# Patient Record
Sex: Male | Born: 1970 | Race: Black or African American | Hispanic: No | Marital: Married | Smoking: Never smoker
Health system: Southern US, Community
[De-identification: ages and names within clinical notes are randomized; demographics above are authoritative.]

## PROBLEM LIST (undated history)

## (undated) DIAGNOSIS — U071 COVID-19: Secondary | ICD-10-CM

## (undated) DIAGNOSIS — Q613 Polycystic kidney, unspecified: Secondary | ICD-10-CM

## (undated) DIAGNOSIS — I10 Essential (primary) hypertension: Secondary | ICD-10-CM

## (undated) DIAGNOSIS — T7840XA Allergy, unspecified, initial encounter: Secondary | ICD-10-CM

## (undated) DIAGNOSIS — R7303 Prediabetes: Secondary | ICD-10-CM

## (undated) HISTORY — DX: Prediabetes: R73.03

## (undated) HISTORY — DX: Polycystic kidney, unspecified: Q61.3

## (undated) HISTORY — DX: Essential (primary) hypertension: I10

## (undated) HISTORY — DX: COVID-19: U07.1

## (undated) HISTORY — DX: Allergy, unspecified, initial encounter: T78.40XA

---

## 2001-11-06 ENCOUNTER — Emergency Department (HOSPITAL_COMMUNITY): Admission: EM | Admit: 2001-11-06 | Discharge: 2001-11-06 | Payer: Self-pay | Admitting: Emergency Medicine

## 2001-11-06 ENCOUNTER — Encounter: Payer: Self-pay | Admitting: Emergency Medicine

## 2014-08-24 ENCOUNTER — Ambulatory Visit: Payer: BC Managed Care – PPO | Admitting: Internal Medicine

## 2014-09-27 ENCOUNTER — Emergency Department (HOSPITAL_COMMUNITY)
Admission: EM | Admit: 2014-09-27 | Discharge: 2014-09-27 | Disposition: A | Discharge status: Home / Self Care | Payer: BC Managed Care – PPO | Attending: Emergency Medicine | Admitting: Emergency Medicine

## 2014-09-27 ENCOUNTER — Encounter (HOSPITAL_COMMUNITY): Payer: Self-pay | Admitting: *Deleted

## 2014-09-27 ENCOUNTER — Emergency Department (HOSPITAL_COMMUNITY): Payer: BC Managed Care – PPO

## 2014-09-27 DIAGNOSIS — N281 Cyst of kidney, acquired: Secondary | ICD-10-CM | POA: Diagnosis not present

## 2014-09-27 DIAGNOSIS — R109 Unspecified abdominal pain: Secondary | ICD-10-CM | POA: Diagnosis present

## 2014-09-27 LAB — URINALYSIS, ROUTINE W REFLEX MICROSCOPIC
Bilirubin Urine: NEGATIVE
GLUCOSE, UA: NEGATIVE mg/dL
Ketones, ur: NEGATIVE mg/dL
Nitrite: NEGATIVE
Protein, ur: NEGATIVE mg/dL
Specific Gravity, Urine: 1.019 (ref 1.005–1.030)
Urobilinogen, UA: 1 mg/dL (ref 0.0–1.0)
pH: 7.5 (ref 5.0–8.0)

## 2014-09-27 LAB — URINE MICROSCOPIC-ADD ON

## 2014-09-27 MED ORDER — OXYCODONE-ACETAMINOPHEN 5-325 MG PO TABS: 2.0000 | ORAL_TABLET | ORAL | Status: DC | PRN

## 2014-09-27 MED ORDER — TAMSULOSIN HCL 0.4 MG PO CAPS: 0.4000 mg | ORAL_CAPSULE | Freq: Two times a day (BID) | ORAL | Status: DC

## 2014-09-27 MED ORDER — ONDANSETRON HCL 4 MG PO TABS: 4.0000 mg | ORAL_TABLET | Freq: Four times a day (QID) | ORAL | Status: DC

## 2014-09-27 NOTE — ED Provider Notes (Signed)
CSN: [id number]     Arrival date & time 09/27/14  1903 History   First MD Initiated Contact with Patient 09/27/14 1915     Chief Complaint  Patient presents with  . Flank Pain     (Consider location/radiation/quality/duration/timing/severity/associated sxs/prior Treatment) HPI Benjamin Griffith is a 44 y.o. male who comes in for evaluation of left flank pain. Patient states he was outside grilling at approximately 4:30 PM when he began to experience a left flank discomfort that he characterizes as "tightness and sharp" that was waxing and waning. At onset it was 9/10, but now in the ED he rates it as a 3/10. He denies dysuria, hematuria, overt abdominal pain. He reports associated diaphoresis during the event as well as nausea without vomiting. Denies fevers, chills, short of breath, chest pain, numbness or weakness, rash.  History reviewed. No pertinent past medical history. History reviewed. No pertinent past surgical history. History reviewed. No pertinent family history. History  Substance Use Topics  . Smoking status: Never Smoker   . Smokeless tobacco: Not on file  . Alcohol Use: No    Review of Systems A 10 point review of systems was completed and was negative except for pertinent positives and negatives as mentioned in the history of present illness     Allergies  Review of patient's allergies indicates no known allergies.  Home Medications   Prior to Admission medications   Medication Sig Start Date End Date Taking? Authorizing Provider  ondansetron (ZOFRAN) 4 MG tablet Take 1 tablet (4 mg total) by mouth every 6 (six) hours. 09/27/14   Comer Locket, PA-C  oxyCODONE-acetaminophen (PERCOCET/ROXICET) 5-325 MG per tablet Take 2 tablets by mouth every 4 (four) hours as needed for severe pain. 09/27/14   Comer Locket, PA-C  tamsulosin (FLOMAX) 0.4 MG CAPS capsule Take 1 capsule (0.4 mg total) by mouth 2 (two) times daily. 09/27/14   Aleiyah Halpin, PA-C   BP 131/73 mmHg   Pulse 92  Temp(Src) 98.6 F (37 C) (Oral)  Resp 19  SpO2 99% Physical Exam  Constitutional: He is oriented to person, place, and time. He appears well-developed and well-nourished.  HENT:  Head: Normocephalic and atraumatic.  Mouth/Throat: Oropharynx is clear and moist.  Eyes: Conjunctivae are normal. Pupils are equal, round, and reactive to light. Right eye exhibits no discharge. Left eye exhibits no discharge. No scleral icterus.  Neck: Neck supple.  Cardiovascular: Normal rate, regular rhythm and normal heart sounds.   Pulmonary/Chest: Effort normal and breath sounds normal. No respiratory distress. He has no wheezes. He has no rales.  Abdominal: Soft. He exhibits no distension and no mass. There is no tenderness. There is no rebound and no guarding.  No CVA tenderness.  Musculoskeletal: He exhibits no tenderness.  Neurological: He is alert and oriented to person, place, and time.  Cranial Nerves II-XII grossly intact  Skin: Skin is warm and dry. No rash noted.  Psychiatric: He has a normal mood and affect.  Nursing note and vitals reviewed.   ED Course  Procedures (including critical care time) Labs Review Labs Reviewed  URINALYSIS, ROUTINE W REFLEX MICROSCOPIC (NOT AT [facility]) - Abnormal; Notable for the following:    Hgb urine dipstick TRACE (*)    Leukocytes, UA TRACE (*)    All other components within normal limits  URINE MICROSCOPIC-ADD ON    Imaging Review US Renal  09/27/2014   CLINICAL DATA:  Left flank pain 1 day  EXAM: RENAL / URINARY TRACT ULTRASOUND COMPLETE  COMPARISON:  None.  FINDINGS: Right Kidney:  Length: 14.1 cm. Multiple right renal cysts. Lower pole cyst 4.5 x 5.1 cm. Midpole complex cyst 2.4 x 3.3 cm. The cysts appears separate and do not appear to represent hydronephrosis.  Left Kidney:  Length: 16.6 cm. Multiple left renal cysts. Complex midpole cyst 5.9 x 4.5 cm. Simple lower pole cyst 5.1 x 6.0 cm. Additional smaller cysts are noted on the left.   Bladder:  Debris in the bladder which could be infection or blood. Bilateral ureteral jets noted by Doppler.  Prostate enlargement measuring 5.2 x 3.5 x 4.0 cm.  IMPRESSION: Multiple bilateral renal cysts. This is not entirely typical for polycystic kidney disease however there are numerous cysts present. Correlate with renal function tests. Negative for hydronephrosis  Debris in the bladder which may represent blood or infection. Correlate with urinalysis.   Electronically Signed   By: Franchot Gallo M.D.   On: 09/27/2014 20:37     EKG Interpretation None     Meds given in ED:  Medications - No data to display  Discharge Medication List as of 09/27/2014  9:28 PM    START taking these medications   Details  ondansetron (ZOFRAN) 4 MG tablet Take 1 tablet (4 mg total) by mouth every 6 (six) hours., Starting 09/27/2014, Until Discontinued, Print    oxyCODONE-acetaminophen (PERCOCET/ROXICET) 5-325 MG per tablet Take 2 tablets by mouth every 4 (four) hours as needed for severe pain., Starting 09/27/2014, Until Discontinued, Print    tamsulosin (FLOMAX) 0.4 MG CAPS capsule Take 1 capsule (0.4 mg total) by mouth 2 (two) times daily., Starting 09/27/2014, Until Discontinued, Print       Filed Vitals:   09/27/14 1908 09/27/14 2127  BP: 135/87 131/73  Pulse: 60 92  Temp: 98.2 F (36.8 C) 98.6 F (37 C)  TempSrc: Oral Oral  Resp: 18 19  SpO2: 97% 99%    MDM  Vitals stable - WNL -afebrile Pt resting comfortably in ED. PE--benign abdominal exam. There is a benign physical exam. Labwork-Trace hgb and leukocytes in urine,  Imaging--Multiple bil renal cysts. No hydroneophrosis  DDX--No evidence of urosepsis, overt UTI. Will trxt for stone. Referral given to f/u with nephro for further evaluation of bilateral renal cysts. Will DC with pain medicines, tamsulosin, Zofran. I discussed all relevant lab findings and imaging results with pt and they verbalized understanding. Discussed f/u with PCP  within 48 hrs and return precautions, pt very amenable to plan.  Final diagnoses:  Bilateral renal cysts        Comer Locket, PA-C 09/27/14 2259  Dorie Rank, MD 09/29/14 2255

## 2014-09-27 NOTE — ED Notes (Signed)
Bed: WA20 Expected date: 09/27/14 Expected time: 6:45 PM Means of arrival: Ambulance Comments: Flank pain

## 2014-09-27 NOTE — Discharge Instructions (Signed)
You were evaluated today for your abdominal discomfort. You are likely experiencing discomfort related to a kidney stone. On ultrasound were also found to have bilateral kidney cysts. It is important to follow nephrology for further evaluation and management of your symptoms. Return to ED for any worsening symptoms. Take your medications as prescribed.

## 2014-09-27 NOTE — ED Notes (Signed)
Patient was doing some light housework and had a sudden onset of left flank pain that radiated down to his groin. He became sweaty and nauseated, this is typical for him when he has pain. Patient was given 30mg  of torodal and 38mcg of fentanyl in route. He also had 4mg  of zofran.

## 2014-10-03 ENCOUNTER — Other Ambulatory Visit (HOSPITAL_COMMUNITY): Payer: Self-pay | Admitting: Urology

## 2014-10-03 DIAGNOSIS — N281 Cyst of kidney, acquired: Secondary | ICD-10-CM

## 2014-10-05 ENCOUNTER — Ambulatory Visit (HOSPITAL_COMMUNITY)
Admission: RE | Admit: 2014-10-05 | Discharge: 2014-10-05 | Disposition: A | Discharge status: Home / Self Care | Payer: BC Managed Care – PPO | Source: Ambulatory Visit | Attending: Urology | Admitting: Urology

## 2014-10-05 DIAGNOSIS — N281 Cyst of kidney, acquired: Secondary | ICD-10-CM | POA: Insufficient documentation

## 2014-10-05 DIAGNOSIS — N289 Disorder of kidney and ureter, unspecified: Secondary | ICD-10-CM | POA: Insufficient documentation

## 2014-10-11 ENCOUNTER — Encounter: Payer: Self-pay | Admitting: Primary Care

## 2014-10-11 ENCOUNTER — Ambulatory Visit (INDEPENDENT_AMBULATORY_CARE_PROVIDER_SITE_OTHER): Payer: BC Managed Care – PPO | Admitting: Primary Care

## 2014-10-11 VITALS — BP 134/94 | HR 60 | Temp 98.0°F | Wt 205.1 lb

## 2014-10-11 DIAGNOSIS — R0981 Nasal congestion: Secondary | ICD-10-CM

## 2014-10-11 MED ORDER — FLUTICASONE PROPIONATE 50 MCG/ACT NA SUSP: 2.0000 | Freq: Every day | NASAL | Status: DC

## 2014-10-11 NOTE — Patient Instructions (Addendum)
Start taking Zyrtec every night for allergy symtptoms. Use Flonase once daily. 2 puffs into each nostril once daily.  Do not use bobby pins in your ears. For ear wax overproduction you may try Debrox drops, which may be purchased over the counter.  Set up an appointment up front to establish with Dr. Deborra Medina, your PCP.  It was nice meeting you!

## 2014-10-11 NOTE — Progress Notes (Signed)
Pre visit review using our clinic review tool, if applicable. No additional management support is needed unless otherwise documented below in the visit note. 

## 2014-10-11 NOTE — Progress Notes (Signed)
   Subjective:    Patient ID: Benjamin Griffith, male    DOB: 1971/03/31, 44 y.o.   MRN: [id number]  HPI  Mr. Fenoglio is a 44 year old male who presents today with a chief complaint of nasal congestion. Every morning for the past 6 months he will experience nasal congestion and will blow out "pink tinged sputum". He reports frontal sinus pressure, headaches. His symptoms are worse when air is blowing directly in his face. He's tried advil sinus and congestion, "relax nasal spray" and mucinex with some relief. He feels well overall, not acutely ill. Denies fevers, shortness of breath, cough.  Review of Systems  Constitutional: Negative for fever and chills.  HENT: Positive for congestion and sinus pressure. Negative for ear pain and sore throat.   Respiratory: Negative for cough and shortness of breath.   Cardiovascular: Negative for chest pain.  Gastrointestinal: Negative for nausea.       No past medical history on file.  History   Social History  . Marital Status: Single    Spouse Name: N/A  . Number of Children: N/A  . Years of Education: N/A   Occupational History  . Not on file.   Social History Main Topics  . Smoking status: Never Smoker   . Smokeless tobacco: Not on file  . Alcohol Use: No  . Drug Use: No  . Sexual Activity: Not on file   Other Topics Concern  . Not on file   Social History Narrative    No past surgical history on file.  No family history on file.  No Known Allergies  No current outpatient prescriptions on file prior to visit.   No current facility-administered medications on file prior to visit.    BP 134/94 mmHg  Pulse 60  Temp(Src) 98 F (36.7 C) (Oral)  Wt 205 lb 1.9 oz (93.042 kg)  SpO2 98%     Objective:   Physical Exam  Constitutional: He appears well-nourished. He does not appear ill.  HENT:  Right Ear: Tympanic membrane and ear canal normal.  Left Ear: Tympanic membrane and ear canal normal.  Nose: Right sinus exhibits no  maxillary sinus tenderness and no frontal sinus tenderness. Left sinus exhibits no maxillary sinus tenderness and no frontal sinus tenderness.  Mouth/Throat: Oropharynx is clear and moist.  Eyes: Conjunctivae are normal. Pupils are equal, round, and reactive to light.  Cardiovascular: Normal rate and regular rhythm.   Pulmonary/Chest: Effort normal and breath sounds normal.  Skin: Skin is warm and dry.          Assessment & Plan:  Nasal congestion:  Related to allergies most likely. No acute symptoms of active sinusitis or bronchitis. He appears well. Exam unremarkable. RX for Flonase daily, and Zyrtec OTC daily at bedtime. Encouraged patient to establish with his PCP as he has not done so at this time. Follow up PRN

## 2014-10-25 ENCOUNTER — Ambulatory Visit (INDEPENDENT_AMBULATORY_CARE_PROVIDER_SITE_OTHER): Payer: BC Managed Care – PPO | Admitting: Primary Care

## 2014-10-25 ENCOUNTER — Encounter: Payer: Self-pay | Admitting: Primary Care

## 2014-10-25 VITALS — BP 134/84 | HR 84 | Temp 98.0°F | Ht 68.0 in | Wt 206.4 lb

## 2014-10-25 DIAGNOSIS — L309 Dermatitis, unspecified: Secondary | ICD-10-CM | POA: Diagnosis not present

## 2014-10-25 DIAGNOSIS — Q613 Polycystic kidney, unspecified: Secondary | ICD-10-CM | POA: Diagnosis not present

## 2014-10-25 NOTE — Progress Notes (Signed)
Pre visit review using our clinic review tool, if applicable. No additional management support is needed unless otherwise documented below in the visit note. 

## 2014-10-25 NOTE — Patient Instructions (Signed)
Start taking Zyrtec every night for allergy symptoms.  Please schedule a physical with me in the next 1-2 months. You will also schedule a lab only appointment one week prior. We will discuss your lab results during your physical.  It was a pleasure to see you today! Please don't hesitate to call me with any questions. Welcome to [facility]!

## 2014-10-25 NOTE — Assessment & Plan Note (Signed)
Present since childhood. Currently not bothersome, does not apply OTC products.

## 2014-10-25 NOTE — Assessment & Plan Note (Signed)
Diagnosed June 2016.  Following with Dr. Alyson Ingles with urology and will be seeing nephrologist later this summer. Will obtain labs at physical next visit.

## 2014-10-25 NOTE — Progress Notes (Signed)
   Subjective:    Patient ID: Benjamin Griffith, male    DOB: 1970-07-23, 44 y.o.   MRN: [id number]  HPI  Benjamin Griffith is a 44 year old male who presents today to establish care and discuss the problems mentioned below. Will obtain old records.   1) Polycystic renal disease: Presented to the emergency department in June with complaints of flank pain. He was found to have about 20 cysts total to bilateral kidneys. He is following with Dr. Alyson Ingles with Urology and has an appointment scheduled for December 2016. He is also to see a nephrologist later this summer. Overall he's had some minor dull pain to his flanks which is tolerable and doesn't inhibit his daily activities. He was once drinking 2 liters of [facility] daily with consumption of other sodas and sweet tea. He's only drinking water and cranberry juice.   2) Eczema: Present for years. Mostly occurs during summer months.     Review of Systems  Constitutional: Negative for fatigue and unexpected weight change.  HENT: Negative for rhinorrhea.   Respiratory: Negative for cough and shortness of breath.   Cardiovascular: Negative for chest pain.  Gastrointestinal: Negative for diarrhea, constipation and blood in stool.  Genitourinary: Negative for difficulty urinating.  Musculoskeletal: Negative for myalgias.       Chronic knee pain  Skin: Negative for rash.  Neurological: Negative for dizziness, numbness and headaches.  Psychiatric/Behavioral:       Denies concerns for anxiety or depression.       History reviewed. No pertinent past medical history.  History   Social History  . Marital Status: Single    Spouse Name: N/A  . Number of Children: N/A  . Years of Education: N/A   Occupational History  . Not on file.   Social History Main Topics  . Smoking status: Never Smoker   . Smokeless tobacco: Not on file  . Alcohol Use: No  . Drug Use: No  . Sexual Activity: Not on file   Other Topics Concern  . Not on file   Social  History Narrative    History reviewed. No pertinent past surgical history.  Family History  Problem Relation Age of Onset  . Hypertension Mother   . Diabetes Father   . Hypertension Father   . Kidney disease Maternal Grandmother   . Kidney disease Paternal Grandmother     No Known Allergies  Current Outpatient Prescriptions on File Prior to Visit  Medication Sig Dispense Refill  . fluticasone (FLONASE) 50 MCG/ACT nasal spray Place 2 sprays into both nostrils daily. (Patient not taking: Reported on 10/25/2014) 16 g 0   No current facility-administered medications on file prior to visit.    BP 134/84 mmHg  Pulse 84  Temp(Src) 98 F (36.7 C) (Oral)  Ht 5\' 8"  (1.727 m)  Wt 206 lb 6.4 oz (93.622 kg)  BMI 31.39 kg/m2  SpO2 97%    Objective:   Physical Exam  Constitutional: He is oriented to person, place, and time.  Cardiovascular: Normal rate and regular rhythm.   Pulmonary/Chest: Effort normal and breath sounds normal.  Abdominal: There is no CVA tenderness.  Musculoskeletal: Normal range of motion.  Neurological: He is alert and oriented to person, place, and time.  Skin: Skin is warm and dry.  Psychiatric: He has a normal mood and affect.          Assessment & Plan:

## 2014-11-11 ENCOUNTER — Other Ambulatory Visit: Payer: Self-pay | Admitting: Primary Care

## 2014-11-11 DIAGNOSIS — Z0000 Encounter for general adult medical examination without abnormal findings: Secondary | ICD-10-CM

## 2014-11-18 ENCOUNTER — Other Ambulatory Visit (INDEPENDENT_AMBULATORY_CARE_PROVIDER_SITE_OTHER): Payer: BC Managed Care – PPO

## 2014-11-18 DIAGNOSIS — Z0000 Encounter for general adult medical examination without abnormal findings: Secondary | ICD-10-CM | POA: Diagnosis not present

## 2014-11-18 LAB — COMPREHENSIVE METABOLIC PANEL
ALBUMIN: 4.1 g/dL (ref 3.5–5.2)
ALT: 18 U/L (ref 0–53)
AST: 22 U/L (ref 0–37)
Alkaline Phosphatase: 123 U/L — ABNORMAL HIGH (ref 39–117)
BILIRUBIN TOTAL: 0.5 mg/dL (ref 0.2–1.2)
BUN: 15 mg/dL (ref 6–23)
CALCIUM: 9.4 mg/dL (ref 8.4–10.5)
CO2: 29 mEq/L (ref 19–32)
Chloride: 102 mEq/L (ref 96–112)
Creatinine, Ser: 1.3 mg/dL (ref 0.40–1.50)
GFR: 77.31 mL/min (ref >60.00)
GLUCOSE: 139 mg/dL — AB (ref 70–99)
Potassium: 4.6 mEq/L (ref 3.5–5.1)
Sodium: 138 mEq/L (ref 135–145)
Total Protein: 7.2 g/dL (ref 6.0–8.3)

## 2014-11-18 LAB — CBC
HCT: 45 % (ref 39.0–52.0)
Hemoglobin: 15.1 g/dL (ref 13.0–17.0)
MCHC: 33.5 g/dL (ref 30.0–36.0)
MCV: 90.3 fl (ref 78.0–100.0)
PLATELETS: 324 10*3/uL (ref 150.0–400.0)
RBC: 4.98 Mil/uL (ref 4.22–5.81)
RDW: 13.5 % (ref 11.5–15.5)
WBC: 6.3 10*3/uL (ref 4.0–10.5)

## 2014-11-18 LAB — LIPID PANEL
Cholesterol: 149 mg/dL (ref 0–200)
HDL: 46.3 mg/dL (ref >39.00)
LDL CALC: 90 mg/dL (ref 0–99)
NonHDL: 102.52
Total CHOL/HDL Ratio: 3
Triglycerides: 64 mg/dL (ref 0.0–149.0)
VLDL: 12.8 mg/dL (ref 0.0–40.0)

## 2014-11-18 LAB — HEMOGLOBIN A1C: Hgb A1c MFr Bld: 5.8 % (ref 4.6–6.5)

## 2014-11-23 ENCOUNTER — Encounter: Payer: Self-pay | Admitting: Primary Care

## 2014-11-23 ENCOUNTER — Ambulatory Visit (INDEPENDENT_AMBULATORY_CARE_PROVIDER_SITE_OTHER): Payer: BC Managed Care – PPO | Admitting: Primary Care

## 2014-11-23 VITALS — BP 132/92 | HR 58 | Temp 97.5°F | Ht 68.0 in | Wt 208.5 lb

## 2014-11-23 DIAGNOSIS — R030 Elevated blood-pressure reading, without diagnosis of hypertension: Secondary | ICD-10-CM

## 2014-11-23 DIAGNOSIS — Z23 Encounter for immunization: Secondary | ICD-10-CM | POA: Diagnosis not present

## 2014-11-23 DIAGNOSIS — Z0000 Encounter for general adult medical examination without abnormal findings: Secondary | ICD-10-CM | POA: Diagnosis not present

## 2014-11-23 DIAGNOSIS — I10 Essential (primary) hypertension: Secondary | ICD-10-CM | POA: Insufficient documentation

## 2014-11-23 NOTE — Assessment & Plan Note (Signed)
Tetanus due, administered today. Labs show borderline diabetes. Discussed the importance of healthy diet and exercise BP also noted to be elevated on 2 separate occasions. He would like to work on exercising and improving his diet to help lower. Exam unremarkable today. He is to follow up with urology in December.

## 2014-11-23 NOTE — Progress Notes (Signed)
Pre visit review using our clinic review tool, if applicable. No additional management support is needed unless otherwise documented below in the visit note. 

## 2014-11-23 NOTE — Patient Instructions (Signed)
You are borderline diabetic.  Start to limit consumption of rice, pasta, and sugary drinks such as lemonade, sodas, sweet tea. This will help to normalize your blood sugar and prevent diabetes.  Your blood pressure is slightly elevated.  Work to increase exercise and improve your diet.  Follow up in 1 month for re-evaluation of blood pressure. If it's still high then we will need to consider medication at our next visit.  It was a pleasure to see you today!

## 2014-11-23 NOTE — Progress Notes (Signed)
Subjective:    Patient ID: Benjamin Griffith, male    DOB: 1970-04-26, 44 y.o.   MRN: [id number]  HPI  Benjamin Griffith is a 44 year old male who presents today for complete physical.  Immunizations: -Tetanus: Unsure of last tetanus. Nothing recorded in [facility] -Influenza: Did not receive last season   Diet: Endorses a fair diet. Breakfast: Skips Lunch: Salad, grilled chicken, sandwiches Dinner: Chicken, pasta, roast, vegetables, rice Beverages: Water, lemonade, cranberry juice Exercise: He exercises 2-3 days weekly at the gym for 2 hours. Will also play basketball Eye exam: Has not completed in over 10 years. Slight change in vision. Dental exam: Has not been in over 8 years  BP Readings from Last 3 Encounters:  11/23/14 132/92  10/25/14 134/84  10/11/14 134/94    2) Elevated BP: Noted on 2 separate office visits. He would like to try to improve his diet and start running again as he once did. He would also like to work on improving his diet.  Review of Systems  Constitutional: Negative for unexpected weight change.  HENT: Negative for rhinorrhea.   Respiratory: Negative for cough and shortness of breath.   Cardiovascular: Negative for chest pain.  Gastrointestinal: Negative for diarrhea and constipation.  Genitourinary: Negative for difficulty urinating.  Musculoskeletal: Negative for myalgias and arthralgias.  Skin: Negative for rash.  Neurological: Negative for dizziness and headaches.  Psychiatric/Behavioral:       Denies concerns for anxiety or depression       No past medical history on file.  Social History   Social History  . Marital Status: Single    Spouse Name: N/A  . Number of Children: N/A  . Years of Education: N/A   Occupational History  . Not on file.   Social History Main Topics  . Smoking status: Never Smoker   . Smokeless tobacco: Not on file  . Alcohol Use: No  . Drug Use: No  . Sexual Activity: Not on file   Other Topics Concern  . Not on file    Social History Narrative    No past surgical history on file.  Family History  Problem Relation Age of Onset  . Hypertension Mother   . Diabetes Father   . Hypertension Father   . Kidney disease Maternal Grandmother   . Kidney disease Paternal Grandmother     No Known Allergies  Current Outpatient Prescriptions on File Prior to Visit  Medication Sig Dispense Refill  . fluticasone (FLONASE) 50 MCG/ACT nasal spray Place 2 sprays into both nostrils daily. 16 g 0   No current facility-administered medications on file prior to visit.    BP 132/92 mmHg  Pulse 58  Temp(Src) 97.5 F (36.4 C) (Oral)  Ht 5\' 8"  (1.727 m)  Wt 208 lb 8 oz (94.575 kg)  BMI 31.71 kg/m2  SpO2 97%    Objective:   Physical Exam  Constitutional: He is oriented to person, place, and time. He appears well-nourished.  HENT:  Right Ear: Tympanic membrane and ear canal normal.  Left Ear: Tympanic membrane and ear canal normal.  Mouth/Throat: Oropharynx is clear and moist.  Eyes: Conjunctivae and EOM are normal. Pupils are equal, round, and reactive to light.  Neck: Neck supple.  Cardiovascular: Normal rate and regular rhythm.   Pulmonary/Chest: Effort normal and breath sounds normal.  Abdominal: Soft. Bowel sounds are normal. There is no tenderness.  Musculoskeletal: Normal range of motion.  Lymphadenopathy:    He has no cervical adenopathy.  Neurological: He is alert and oriented to person, place, and time. He has normal reflexes. No cranial nerve deficit.  Skin: Skin is warm and dry.  Psychiatric: He has a normal mood and affect.          Assessment & Plan:

## 2014-11-23 NOTE — Assessment & Plan Note (Signed)
Elevated in June and today. Will allow him to work on lowering through diet and exercise. Will follow up in one month, if continues to be elevated, will treat with medication.

## 2014-12-22 ENCOUNTER — Ambulatory Visit: Payer: BC Managed Care – PPO | Admitting: Primary Care

## 2015-01-02 ENCOUNTER — Ambulatory Visit: Payer: BC Managed Care – PPO | Admitting: Primary Care

## 2015-01-06 ENCOUNTER — Ambulatory Visit (INDEPENDENT_AMBULATORY_CARE_PROVIDER_SITE_OTHER): Payer: BC Managed Care – PPO | Admitting: Primary Care

## 2015-01-06 ENCOUNTER — Encounter: Payer: Self-pay | Admitting: Primary Care

## 2015-01-06 VITALS — BP 144/102 | HR 68 | Temp 98.2°F | Ht 68.0 in | Wt 202.1 lb

## 2015-01-06 DIAGNOSIS — R0981 Nasal congestion: Secondary | ICD-10-CM

## 2015-01-06 DIAGNOSIS — J3489 Other specified disorders of nose and nasal sinuses: Secondary | ICD-10-CM | POA: Diagnosis not present

## 2015-01-06 DIAGNOSIS — R030 Elevated blood-pressure reading, without diagnosis of hypertension: Secondary | ICD-10-CM | POA: Diagnosis not present

## 2015-01-06 MED ORDER — PREDNISONE 20 MG PO TABS: ORAL_TABLET | ORAL | Status: DC

## 2015-01-06 NOTE — Progress Notes (Signed)
Pre visit review using our clinic review tool, if applicable. No additional management support is needed unless otherwise documented below in the visit note. 

## 2015-01-06 NOTE — Assessment & Plan Note (Signed)
Elevated in clinic today, home readings elevated. Highly recommended treatment with medication. He refuses medication and insists on working to reduce weight and eating habits. Discussed long term effects of hypertension and consequence of continued elevation. He is to check his BP daily and notify me if readings are above 140/90. He will follow up in 2 months, if no improvement then will place on medication.

## 2015-01-06 NOTE — Progress Notes (Signed)
   Subjective:    Patient ID: Benjamin Griffith, male    DOB: 04/22/1970, 44 y.o.   MRN: [id number]  HPI  Benjamin Griffith is a 44 year old male who presents today for follow up. He presented as a new patient in June and noted to have an elevated blood pressure. He's had an elevated reading in August and again today in the clinic. He's recently being treated for polycystic kidney disease that was diagnosed in June 2016. He's been checking his blood pressure at home and has been getting readings of 138/88, 137/90, 144/80. He is not interested in taking medication and has been working to reduce weight through exercise.   BP Readings from Last 3 Encounters:  01/06/15 144/102  11/23/14 132/92  10/25/14 134/84     Review of Systems  Eyes: Negative for visual disturbance.  Respiratory: Negative for shortness of breath.   Cardiovascular: Negative for chest pain.  Neurological: Negative for dizziness and headaches.       No past medical history on file.  Social History   Social History  . Marital Status: Single    Spouse Name: N/A  . Number of Children: N/A  . Years of Education: N/A   Occupational History  . Not on file.   Social History Main Topics  . Smoking status: Never Smoker   . Smokeless tobacco: Not on file  . Alcohol Use: No  . Drug Use: No  . Sexual Activity: Not on file   Other Topics Concern  . Not on file   Social History Narrative    No past surgical history on file.  Family History  Problem Relation Age of Onset  . Hypertension Mother   . Diabetes Father   . Hypertension Father   . Kidney disease Maternal Grandmother   . Kidney disease Paternal Grandmother     No Known Allergies  Current Outpatient Prescriptions on File Prior to Visit  Medication Sig Dispense Refill  . cetirizine (ZYRTEC) 10 MG tablet Take 10 mg by mouth daily as needed for allergies.    . fluticasone (FLONASE) 50 MCG/ACT nasal spray Place 2 sprays into both nostrils daily. (Patient not  taking: Reported on 01/06/2015) 16 g 0   No current facility-administered medications on file prior to visit.    BP 144/102 mmHg  Pulse 68  Temp(Src) 98.2 F (36.8 C) (Oral)  Ht 5\' 8"  (1.727 m)  Wt 202 lb 1.9 oz (91.681 kg)  BMI 30.74 kg/m2  SpO2 97%    Objective:   Physical Exam  Constitutional: He appears well-nourished.  Cardiovascular: Normal rate and regular rhythm.   Pulmonary/Chest: Effort normal and breath sounds normal.  Skin: Skin is warm and dry.          Assessment & Plan:

## 2015-01-06 NOTE — Patient Instructions (Signed)
Start checking your blood pressure every day. Check it around the same time daily after 30 minutes of rest. Record your readings and bring them to your next visit.  Work to decrease consumption of salt (fried foods, fatty foods, frozen dinners, etc.)  I highly recommend we start a blood pressure medication today.  Start prednisone tablets for nasal congestion and sinus pressure. Take 3 tablets daily for 3 days, then 2 tablets for 3 days, then 1 tablet for 3 days.  Please schedule a follow up appointment in 2 months.  It was a pleasure to see you today!

## 2015-01-19 ENCOUNTER — Emergency Department (HOSPITAL_COMMUNITY): Payer: BC Managed Care – PPO

## 2015-01-19 ENCOUNTER — Emergency Department (HOSPITAL_COMMUNITY)
Admission: EM | Admit: 2015-01-19 | Discharge: 2015-01-19 | Disposition: A | Discharge status: Home / Self Care | Payer: BC Managed Care – PPO | Attending: Emergency Medicine | Admitting: Emergency Medicine

## 2015-01-19 ENCOUNTER — Encounter (HOSPITAL_COMMUNITY): Payer: Self-pay | Admitting: Emergency Medicine

## 2015-01-19 DIAGNOSIS — R079 Chest pain, unspecified: Secondary | ICD-10-CM | POA: Diagnosis present

## 2015-01-19 DIAGNOSIS — M79602 Pain in left arm: Secondary | ICD-10-CM | POA: Insufficient documentation

## 2015-01-19 DIAGNOSIS — Z79899 Other long term (current) drug therapy: Secondary | ICD-10-CM | POA: Diagnosis not present

## 2015-01-19 LAB — BASIC METABOLIC PANEL
Anion gap: 9 (ref 5–15)
BUN: 19 mg/dL (ref 6–20)
CHLORIDE: 103 mmol/L (ref 101–111)
CO2: 26 mmol/L (ref 22–32)
CREATININE: 1.11 mg/dL (ref 0.61–1.24)
Calcium: 9.4 mg/dL (ref 8.9–10.3)
GFR calc Af Amer: >60 mL/min (ref >60)
GFR calc non Af Amer: >60 mL/min (ref >60)
GLUCOSE: 102 mg/dL — AB (ref 65–99)
POTASSIUM: 4 mmol/L (ref 3.5–5.1)
SODIUM: 138 mmol/L (ref 135–145)

## 2015-01-19 LAB — I-STAT TROPONIN, ED: Troponin i, poc: 0 ng/mL (ref 0.00–0.08)

## 2015-01-19 LAB — CBC
HEMATOCRIT: 46.1 % (ref 39.0–52.0)
Hemoglobin: 15.5 g/dL (ref 13.0–17.0)
MCH: 30.5 pg (ref 26.0–34.0)
MCHC: 33.6 g/dL (ref 30.0–36.0)
MCV: 90.6 fL (ref 78.0–100.0)
Platelets: 364 10*3/uL (ref 150–400)
RBC: 5.09 MIL/uL (ref 4.22–5.81)
RDW: 13.1 % (ref 11.5–15.5)
WBC: 8.2 10*3/uL (ref 4.0–10.5)

## 2015-01-19 NOTE — ED Notes (Addendum)
Pt complaining of left chest pain starting last night with radiation into left arm with left arm numbness and tingling. Recently been having difficulty controlling his blood pressure d/t his polycystic kidney disease. Pt denies SOB, N/V/D, in no obvious distress in triage, vitals WNL. States the last time something like this happened it ended up being a pulled muscle. Equal radial pulses bilaterally, PERRL, heart/lung sounds clear to auscultation.

## 2015-01-19 NOTE — ED Notes (Signed)
Pt with Hx of hypertension, not currently taking meds. Denies lightheadedness, dizziness, steady gait upon d/c in NAD.

## 2015-01-19 NOTE — Discharge Instructions (Signed)
Follow up with your md next week.  Return if  problems °

## 2015-01-19 NOTE — ED Provider Notes (Signed)
CSN: [id number]     Arrival date & time 01/19/15  1507 History   First MD Initiated Contact with Patient 01/19/15 1646     Chief Complaint  Patient presents with  . Chest Pain  . Arm Pain     (Consider location/radiation/quality/duration/timing/severity/associated sxs/prior Treatment) Patient is a 44 y.o. male presenting with chest pain and arm pain. The history is provided by the patient (The patient states that he's had some left-sided chest pain off and on. I last for a couple minutes. Not related to exertion. No family history of coronary artery disease).  Chest Pain Pain location:  L chest Pain quality: aching   Pain radiates to:  Does not radiate Pain radiates to the back: no   Pain severity:  Mild Timing:  Intermittent Progression:  Resolved Chronicity:  New Context: not breathing   Relieved by:  Nothing Associated symptoms: no abdominal pain, no back pain, no cough, no fatigue and no headache   Arm Pain Associated symptoms include chest pain. Pertinent negatives include no abdominal pain and no headaches.    History reviewed. No pertinent past medical history. History reviewed. No pertinent past surgical history. Family History  Problem Relation Age of Onset  . Hypertension Mother   . Diabetes Father   . Hypertension Father   . Kidney disease Maternal Grandmother   . Kidney disease Paternal Grandmother    Social History  Substance Use Topics  . Smoking status: Never Smoker   . Smokeless tobacco: None  . Alcohol Use: No    Review of Systems  Constitutional: Negative for appetite change and fatigue.  HENT: Negative for congestion, ear discharge and sinus pressure.   Eyes: Negative for discharge.  Respiratory: Negative for cough.   Cardiovascular: Positive for chest pain.  Gastrointestinal: Negative for abdominal pain and diarrhea.  Genitourinary: Negative for frequency and hematuria.  Musculoskeletal: Negative for back pain.  Skin: Negative for rash.   Neurological: Negative for seizures and headaches.  Psychiatric/Behavioral: Negative for hallucinations.      Allergies  Review of patient's allergies indicates no known allergies.  Home Medications   Prior to Admission medications   Medication Sig Start Date End Date Taking? Authorizing Provider  cetirizine (ZYRTEC) 10 MG tablet Take 10 mg by mouth daily as needed for allergies.   Yes Historical Provider, MD  ibuprofen (ADVIL,MOTRIN) 200 MG tablet Take 200 mg by mouth every 6 (six) hours as needed for moderate pain.   Yes Historical Provider, MD  Multiple Vitamin (MULTIVITAMIN WITH MINERALS) TABS tablet Take 1 tablet by mouth daily.   Yes Historical Provider, MD  Omega-3 Fatty Acids (FISH OIL PO) Take 1 capsule by mouth 3 (three) times daily.   Yes Historical Provider, MD  oxymetazoline (AFRIN) 0.05 % nasal spray Place 1 spray into both nostrils 2 (two) times daily as needed for congestion.   Yes Historical Provider, MD  fluticasone (FLONASE) 50 MCG/ACT nasal spray Place 2 sprays into both nostrils daily. Patient not taking: Reported on 01/06/2015 10/11/14   Pleas Koch, NP  predniSONE (DELTASONE) 20 MG tablet Take 3 tablets by mouth daily for 3 days, then 2 tablets for 3 days, then 1 tablet for 3 days. Patient not taking: Reported on 01/19/2015 01/06/15   Pleas Koch, NP   BP 143/96 mmHg  Pulse 74  Temp(Src) 98.2 F (36.8 C) (Oral)  Resp 20  SpO2 100% Physical Exam  Constitutional: He is oriented to person, place, and time. He appears well-developed.  HENT:  Head: Normocephalic.  Eyes: Conjunctivae and EOM are normal. No scleral icterus.  Neck: Neck supple. No thyromegaly present.  Cardiovascular: Normal rate and regular rhythm.  Exam reveals no gallop and no friction rub.   No murmur heard. Pulmonary/Chest: No stridor. He has no wheezes. He has no rales. He exhibits no tenderness.  Abdominal: He exhibits no distension. There is no tenderness. There is no rebound.   Musculoskeletal: Normal range of motion. He exhibits no edema.  Lymphadenopathy:    He has no cervical adenopathy.  Neurological: He is oriented to person, place, and time. He exhibits normal muscle tone. Coordination normal.  Skin: No rash noted. No erythema.  Psychiatric: He has a normal mood and affect. His behavior is normal.    ED Course  Procedures (including critical care time) Labs Review Labs Reviewed  BASIC METABOLIC PANEL - Abnormal; Notable for the following:    Glucose, Bld 102 (*)    All other components within normal limits  CBC  I-STAT TROPOININ, ED    Imaging Review Dg Chest 2 View  01/19/2015   CLINICAL DATA:  Left-sided chest pain for 1 day.  EXAM: CHEST  2 VIEW  COMPARISON:  None.  FINDINGS: The heart size and mediastinal contours are within normal limits. Both lungs are clear. The visualized skeletal structures are unremarkable.  IMPRESSION: Normal chest x-ray.   Electronically Signed   By: Marijo Sanes M.D.   On: 01/19/2015 16:06   I have personally reviewed and evaluated these images and lab results as part of my medical decision-making.   EKG Interpretation   Date/Time:  Thursday January 19 2015 15:12:47 EDT Ventricular Rate:  76 PR Interval:  145 QRS Duration: 81 QT Interval:  386 QTC Calculation: 434 R Axis:   64 Text Interpretation:  Sinus rhythm Baseline wander in lead(s) II III aVR  aVF Confirmed by Zenya Hickam  MD, Jomel Whittlesey ([id number]) on 01/19/2015 4:48:33 PM      MDM   Final diagnoses:  Chest pain at rest    Chest pain. With normal labs EKG and chest x-ray. Doubtful that his coronary artery disease. Suspect chest wall pain. Patient is to follow-up with his PCP next week    Milton Ferguson, MD 01/19/15 1929

## 2015-01-20 ENCOUNTER — Telehealth: Payer: Self-pay | Admitting: Primary Care

## 2015-01-20 NOTE — Telephone Encounter (Signed)
Will you ask Mr. Blok how his BP readings have been over the past 2 weeks? Thanks.

## 2015-01-23 NOTE — Telephone Encounter (Signed)
Called and notified patient of Kate's comments. Patient verbalized understanding. BP readings have been the same, elevated readings. Follow up is schedule up for 01/25/15

## 2015-01-23 NOTE — Telephone Encounter (Signed)
Message left for patient to return my call.  

## 2015-01-25 ENCOUNTER — Encounter: Payer: Self-pay | Admitting: Primary Care

## 2015-01-25 ENCOUNTER — Ambulatory Visit (INDEPENDENT_AMBULATORY_CARE_PROVIDER_SITE_OTHER): Payer: BC Managed Care – PPO | Admitting: Primary Care

## 2015-01-25 VITALS — BP 162/102 | HR 67 | Temp 97.8°F | Ht 68.0 in | Wt 206.0 lb

## 2015-01-25 DIAGNOSIS — I10 Essential (primary) hypertension: Secondary | ICD-10-CM | POA: Diagnosis not present

## 2015-01-25 MED ORDER — AMLODIPINE BESYLATE 10 MG PO TABS: 10.0000 mg | ORAL_TABLET | Freq: Every day | ORAL | Status: DC

## 2015-01-25 NOTE — Patient Instructions (Signed)
Start Amlodipine 10 mg tablets for high blood pressure. Take 1 tablet by mouth once daily.  Check your blood pressure daily, around the same time of day, for the next 2 weeks.   Ensure that you have rested for 30 minutes prior to checking your blood pressure. Record your readings and bring them to your next visit.  Follow up in 2 weeks for re-evaluation.  Hypertension Hypertension, commonly called high blood pressure, is when the force of blood pumping through your arteries is too strong. Your arteries are the blood vessels that carry blood from your heart throughout your body. A blood pressure reading consists of a higher number over a lower number, such as 110/72. The higher number (systolic) is the pressure inside your arteries when your heart pumps. The lower number (diastolic) is the pressure inside your arteries when your heart relaxes. Ideally you want your blood pressure below 120/80. Hypertension forces your heart to work harder to pump blood. Your arteries may become narrow or stiff. Having untreated or uncontrolled hypertension can cause heart attack, stroke, kidney disease, and other problems. RISK FACTORS Some risk factors for high blood pressure are controllable. Others are not.  Risk factors you cannot control include:   Race. You may be at higher risk if you are African American.  Age. Risk increases with age.  Gender. Men are at higher risk than women before age 17 years. After age 66, women are at higher risk than men. Risk factors you can control include:  Not getting enough exercise or physical activity.  Being overweight.  Getting too much fat, sugar, calories, or salt in your diet.  Drinking too much alcohol. SIGNS AND SYMPTOMS Hypertension does not usually cause signs or symptoms. Extremely high blood pressure (hypertensive crisis) may cause headache, anxiety, shortness of breath, and nosebleed. DIAGNOSIS To check if you have hypertension, your health care  provider will measure your blood pressure while you are seated, with your arm held at the level of your heart. It should be measured at least twice using the same arm. Certain conditions can cause a difference in blood pressure between your right and left arms. A blood pressure reading that is higher than normal on one occasion does not mean that you need treatment. If it is not clear whether you have high blood pressure, you may be asked to return on a different day to have your blood pressure checked again. Or, you may be asked to monitor your blood pressure at home for 1 or more weeks. TREATMENT Treating high blood pressure includes making lifestyle changes and possibly taking medicine. Living a healthy lifestyle can help lower high blood pressure. You may need to change some of your habits. Lifestyle changes may include:  Following the DASH diet. This diet is high in fruits, vegetables, and whole grains. It is low in salt, red meat, and added sugars.  Keep your sodium intake below 2,300 mg per day.  Getting at least 30-45 minutes of aerobic exercise at least 4 times per week.  Losing weight if necessary.  Not smoking.  Limiting alcoholic beverages.  Learning ways to reduce stress. Your health care provider may prescribe medicine if lifestyle changes are not enough to get your blood pressure under control, and if one of the following is true:  You are 45-67 years of age and your systolic blood pressure is above 140.  You are 72 years of age or older, and your systolic blood pressure is above 150.  Your diastolic blood  pressure is above 90.  You have diabetes, and your systolic blood pressure is over 294 or your diastolic blood pressure is over 90.  You have kidney disease and your blood pressure is above 140/90.  You have heart disease and your blood pressure is above 140/90. Your personal target blood pressure may vary depending on your medical conditions, your age, and other  factors. HOME CARE INSTRUCTIONS  Have your blood pressure rechecked as directed by your health care provider.   Take medicines only as directed by your health care provider. Follow the directions carefully. Blood pressure medicines must be taken as prescribed. The medicine does not work as well when you skip doses. Skipping doses also puts you at risk for problems.  Do not smoke.   Monitor your blood pressure at home as directed by your health care provider. SEEK MEDICAL CARE IF:   You think you are having a reaction to medicines taken.  You have recurrent headaches or feel dizzy.  You have swelling in your ankles.  You have trouble with your vision. SEEK IMMEDIATE MEDICAL CARE IF:  You develop a severe headache or confusion.  You have unusual weakness, numbness, or feel faint.  You have severe chest or abdominal pain.  You vomit repeatedly.  You have trouble breathing. MAKE SURE YOU:   Understand these instructions.  Will watch your condition.  Will get help right away if you are not doing well or get worse.   This information is not intended to replace advice given to you by your health care provider. Make sure you discuss any questions you have with your health care provider.   Document Released: 04/01/2005 Document Revised: 08/16/2014 Document Reviewed: 01/22/2013 Elsevier Interactive Patient Education [employer].

## 2015-01-25 NOTE — Progress Notes (Signed)
Subjective:    Patient ID: Benjamin Griffith, male    DOB: 08/29/1970, 44 y.o.   MRN: [id number]  HPI  Benjamin Griffith is a 44 year old male who presents today for follow up of elevated blood pressure readings. He has been evaluated several times in the past for elevated readings and wanted to try to reduce his blood pressure through diet and exercise. Last visit his readings were still above goal and medication was highly recommended. He declined medication last visit. He was to check his blood pressure for 2 weeks and call them into our office. He called yesterday and his readings have been above goal of 140/90. They have been running 130-160/90-100.  He is interested in starting medication today as we discussed the long term effects of untreated hypertension during our last several visits. Denies chest pain, shortness of breath. He's experienced some headaches and intermittent dizziness.   BP Readings from Last 3 Encounters:  01/25/15 162/102  01/19/15 142/101  01/06/15 144/102     Review of Systems  Respiratory: Negative for shortness of breath.   Cardiovascular: Negative for chest pain.  Neurological: Positive for dizziness and headaches.       No past medical history on file.  Social History   Social History  . Marital Status: Single    Spouse Name: N/A  . Number of Children: N/A  . Years of Education: N/A   Occupational History  . Not on file.   Social History Main Topics  . Smoking status: Never Smoker   . Smokeless tobacco: Not on file  . Alcohol Use: No  . Drug Use: No  . Sexual Activity: Not on file   Other Topics Concern  . Not on file   Social History Narrative    No past surgical history on file.  Family History  Problem Relation Age of Onset  . Hypertension Mother   . Diabetes Father   . Hypertension Father   . Kidney disease Maternal Grandmother   . Kidney disease Paternal Grandmother     No Known Allergies  Current Outpatient Prescriptions on File  Prior to Visit  Medication Sig Dispense Refill  . cetirizine (ZYRTEC) 10 MG tablet Take 10 mg by mouth daily as needed for allergies.    Marland Kitchen ibuprofen (ADVIL,MOTRIN) 200 MG tablet Take 200 mg by mouth every 6 (six) hours as needed for moderate pain.    . Multiple Vitamin (MULTIVITAMIN WITH MINERALS) TABS tablet Take 1 tablet by mouth daily.    . Omega-3 Fatty Acids (FISH OIL PO) Take 1 capsule by mouth 3 (three) times daily.    Marland Kitchen oxymetazoline (AFRIN) 0.05 % nasal spray Place 1 spray into both nostrils 2 (two) times daily as needed for congestion.    . fluticasone (FLONASE) 50 MCG/ACT nasal spray Place 2 sprays into both nostrils daily. (Patient not taking: Reported on 01/06/2015) 16 g 0  . predniSONE (DELTASONE) 20 MG tablet Take 3 tablets by mouth daily for 3 days, then 2 tablets for 3 days, then 1 tablet for 3 days. (Patient not taking: Reported on 01/19/2015) 18 tablet 0   No current facility-administered medications on file prior to visit.    BP 162/102 mmHg  Pulse 67  Temp(Src) 97.8 F (36.6 C) (Oral)  Ht 5\' 8"  (1.727 m)  Wt 206 lb (93.441 kg)  BMI 31.33 kg/m2  SpO2 97%    Objective:   Physical Exam  Constitutional: He appears well-nourished.  Cardiovascular: Normal rate and regular rhythm.  Pulmonary/Chest: Effort normal and breath sounds normal.  Skin: Skin is warm and dry.          Assessment & Plan:

## 2015-01-25 NOTE — Progress Notes (Signed)
Pre visit review using our clinic review tool, if applicable. No additional management support is needed unless otherwise documented below in the visit note. 

## 2015-01-25 NOTE — Assessment & Plan Note (Signed)
Elevated readings at home of 130-160/90-100's. He is agreeable to medication today. Start Amlodipine 10 mg today. Discussed potential side effects. He is to continue to check his BP daily and bring his readings to our next visit. Follow up in 2 weeks for re-evaluation.

## 2015-02-13 ENCOUNTER — Encounter: Payer: Self-pay | Admitting: Primary Care

## 2015-02-13 ENCOUNTER — Ambulatory Visit (INDEPENDENT_AMBULATORY_CARE_PROVIDER_SITE_OTHER): Payer: BC Managed Care – PPO | Admitting: Primary Care

## 2015-02-13 VITALS — BP 126/82 | HR 81 | Temp 98.0°F | Ht 68.0 in | Wt 205.4 lb

## 2015-02-13 DIAGNOSIS — I10 Essential (primary) hypertension: Secondary | ICD-10-CM

## 2015-02-13 NOTE — Progress Notes (Signed)
   Subjective:    Patient ID: Benjamin Griffith, male    DOB: [birth year], 44 y.o.   MRN: [id number]  HPI  Benjamin Griffith is a 44 year old male who presents today for follow up of hypertension. He's been evaluated several times for elevated readings during previous visits but did not want to be initated on oral medications. Last visit he decided to start medication after he'd noticed some elevated readings at home. He was iniitated on Amlopdipine 10 mg last visit.  Since his last visit his BP is much improved in the clinic. He's been checking his BP at [facility] and [facility] and has been getting readings of 130-160/90's. He denies headaches, shortness of breath, chest pain, dizziness. He believes that the machines at [facility] were malfunctioning a few times.  BP Readings from Last 3 Encounters:  02/13/15 126/82  01/25/15 162/102  01/19/15 142/101     Review of Systems  Respiratory: Negative for shortness of breath.   Cardiovascular: Negative for chest pain and leg swelling.  Neurological: Negative for dizziness and headaches.       No past medical history on file.  Social History   Social History  . Marital Status: Single    Spouse Name: N/A  . Number of Children: N/A  . Years of Education: N/A   Occupational History  . Not on file.   Social History Main Topics  . Smoking status: Never Smoker   . Smokeless tobacco: Not on file  . Alcohol Use: No  . Drug Use: No  . Sexual Activity: Not on file   Other Topics Concern  . Not on file   Social History Narrative    No past surgical history on file.  Family History  Problem Relation Age of Onset  . Hypertension Mother   . Diabetes Father   . Hypertension Father   . Kidney disease Maternal Grandmother   . Kidney disease Paternal Grandmother     No Known Allergies  Current Outpatient Prescriptions on File Prior to Visit  Medication Sig Dispense Refill  . amLODipine (NORVASC) 10 MG tablet Take 1 tablet (10 mg total) by mouth  daily. 30 tablet 3  . cetirizine (ZYRTEC) 10 MG tablet Take 10 mg by mouth daily as needed for allergies.    Marland Kitchen ibuprofen (ADVIL,MOTRIN) 200 MG tablet Take 200 mg by mouth every 6 (six) hours as needed for moderate pain.    . Multiple Vitamin (MULTIVITAMIN WITH MINERALS) TABS tablet Take 1 tablet by mouth daily.    . Omega-3 Fatty Acids (FISH OIL PO) Take 1 capsule by mouth 3 (three) times daily.     No current facility-administered medications on file prior to visit.    BP 126/82 mmHg  Pulse 81  Temp(Src) 98 F (36.7 C) (Oral)  Ht 5\' 8"  (1.727 m)  Wt 205 lb 6.4 oz (93.169 kg)  BMI 31.24 kg/m2  SpO2 98%    Objective:   Physical Exam  Constitutional: He appears well-nourished.  Cardiovascular: Normal rate and regular rhythm.   Pulmonary/Chest: Effort normal and breath sounds normal.  Skin: Skin is warm and dry.          Assessment & Plan:

## 2015-02-13 NOTE — Patient Instructions (Signed)
Schedule a lab only appointment after November 5th for diabetes recheck.  Check your blood pressure daily, around the same time of day once to twice weekly.  Ensure that you have rested for 30 minutes prior to checking your blood pressure. Record your readings and notify me if they get above 140/90 consistently.  Please schedule a follow up appointment in 3 months for re-evaluation of blood pressure.  It was a pleasure to see you today!

## 2015-02-13 NOTE — Progress Notes (Signed)
Pre visit review using our clinic review tool, if applicable. No additional management support is needed unless otherwise documented below in the visit note. 

## 2015-02-14 NOTE — Assessment & Plan Note (Signed)
Improved since initiation of Amlodipine 10 mg. He is to notify me if he gets numbers above 150/90 consecutively at home. Continue Amlodipine, follow up in 3 months.

## 2015-02-17 ENCOUNTER — Other Ambulatory Visit: Payer: Self-pay | Admitting: Primary Care

## 2015-02-17 DIAGNOSIS — R7303 Prediabetes: Secondary | ICD-10-CM

## 2015-02-21 ENCOUNTER — Other Ambulatory Visit (INDEPENDENT_AMBULATORY_CARE_PROVIDER_SITE_OTHER): Payer: BC Managed Care – PPO

## 2015-02-21 DIAGNOSIS — R7303 Prediabetes: Secondary | ICD-10-CM

## 2015-02-21 LAB — HEMOGLOBIN A1C: Hgb A1c MFr Bld: 5.9 % (ref 4.6–6.5)

## 2015-02-22 ENCOUNTER — Encounter: Payer: Self-pay | Admitting: *Deleted

## 2015-03-16 ENCOUNTER — Other Ambulatory Visit: Payer: Self-pay | Admitting: Urology

## 2015-03-16 DIAGNOSIS — N281 Cyst of kidney, acquired: Secondary | ICD-10-CM

## 2015-03-28 ENCOUNTER — Ambulatory Visit (HOSPITAL_COMMUNITY)
Admission: RE | Admit: 2015-03-28 | Discharge: 2015-03-28 | Disposition: A | Discharge status: Home / Self Care | Payer: BC Managed Care – PPO | Source: Ambulatory Visit | Attending: Urology | Admitting: Urology

## 2015-03-28 DIAGNOSIS — N281 Cyst of kidney, acquired: Secondary | ICD-10-CM | POA: Insufficient documentation

## 2015-05-16 ENCOUNTER — Encounter: Payer: Self-pay | Admitting: Primary Care

## 2015-05-16 ENCOUNTER — Ambulatory Visit (INDEPENDENT_AMBULATORY_CARE_PROVIDER_SITE_OTHER): Payer: BC Managed Care – PPO | Admitting: Primary Care

## 2015-05-16 VITALS — BP 122/86 | HR 79 | Temp 97.9°F | Ht 68.0 in | Wt 209.8 lb

## 2015-05-16 DIAGNOSIS — R351 Nocturia: Secondary | ICD-10-CM | POA: Diagnosis not present

## 2015-05-16 DIAGNOSIS — I10 Essential (primary) hypertension: Secondary | ICD-10-CM | POA: Diagnosis not present

## 2015-05-16 DIAGNOSIS — R7303 Prediabetes: Secondary | ICD-10-CM

## 2015-05-16 NOTE — Progress Notes (Signed)
Subjective:    Patient ID: Benjamin Griffith, male    DOB: 12/12/1970, 45 y.o.   MRN: [id number]  HPI  Mr. Sammarco is a 45 year old male who presents today for follow up of hypertension. He was initiated on Amlodipine 10 mg in mid to late 2016. He was evaluated in early November 2016 with improvement in blood pressure.   His blood pressure is stable in the office today. He checks his blood pressure once to twice every 2 weeks. He will get readings averaging 130's/80's. Denies chest pain, dizziness, shortness of breath, headaches. He is compliant with his amlodipine 10 mg. He does endorse swelling of his ankles in the late evenings after work. He does stand most of the day.   BP Readings from Last 3 Encounters:  05/16/15 122/86  02/13/15 126/82  01/25/15 162/102   2) Nocturia: Present at night since starting the Amlodipine. He will get up once to twice on average in the middle of night. Denies difficulty initiating his stream, dysuria, hematuria, family history of prostate enlargement or cancers.    Review of Systems  Respiratory: Negative for shortness of breath.   Cardiovascular: Negative for chest pain.       Mild ankle edema in the evenings.  Genitourinary: Negative for dysuria and hematuria.       Nocturia.  Neurological: Negative for dizziness and headaches.       Past Medical History  Diagnosis Date  . Hypertension     Social History   Social History  . Marital Status: Single    Spouse Name: N/A  . Number of Children: N/A  . Years of Education: N/A   Occupational History  . Not on file.   Social History Main Topics  . Smoking status: Never Smoker   . Smokeless tobacco: Not on file  . Alcohol Use: No  . Drug Use: No  . Sexual Activity: Not on file   Other Topics Concern  . Not on file   Social History Narrative    No past surgical history on file.  Family History  Problem Relation Age of Onset  . Hypertension Mother   . Diabetes Father   . Hypertension  Father   . Kidney disease Maternal Grandmother   . Kidney disease Paternal Grandmother     No Known Allergies  Current Outpatient Prescriptions on File Prior to Visit  Medication Sig Dispense Refill  . amLODipine (NORVASC) 10 MG tablet Take 1 tablet (10 mg total) by mouth daily. 30 tablet 3  . cetirizine (ZYRTEC) 10 MG tablet Take 10 mg by mouth daily as needed for allergies.    Marland Kitchen ibuprofen (ADVIL,MOTRIN) 200 MG tablet Take 200 mg by mouth every 6 (six) hours as needed for moderate pain.    . Multiple Vitamin (MULTIVITAMIN WITH MINERALS) TABS tablet Take 1 tablet by mouth daily.    . Omega-3 Fatty Acids (FISH OIL PO) Take 1 capsule by mouth 3 (three) times daily.     No current facility-administered medications on file prior to visit.    BP 122/86 mmHg  Pulse 79  Temp(Src) 97.9 F (36.6 C) (Oral)  Ht 5\' 8"  (1.727 m)  Wt 209 lb 12.8 oz (95.165 kg)  BMI 31.91 kg/m2  SpO2 98%    Objective:   Physical Exam  Constitutional: He appears well-nourished.  Cardiovascular: Normal rate and regular rhythm.   No lower extremity edema noted.  Pulmonary/Chest: Effort normal and breath sounds normal.  Skin: Skin is warm and  dry.          Assessment & Plan:  Nocturia:  Present since initiation of Amlodipine 10 mg.  Will get up once to twice nightly on average. No difficulty initiating stream, hematuria, FH of prostate disorders. Low suspicion for prostate involvement, but will check PSA as he is worried. Declines prostate examination today. Likely due to antihypertensive. Will continue to monitor.

## 2015-05-16 NOTE — Progress Notes (Signed)
Pre visit review using our clinic review tool, if applicable. No additional management support is needed unless otherwise documented below in the visit note. 

## 2015-05-16 NOTE — Patient Instructions (Signed)
Schedule a lab only appointment after February 8th 2017. We will recheck to ensure you are no longer borderline diabetic, and also your prostate function.  Continue amlodipine 10 mg tablets for blood pressure. Please notify me if the ankle swelling becomes bothersome.  Follow up in mid to late August for your annual physical exam.  It was a pleasure to see you today!

## 2015-05-16 NOTE — Assessment & Plan Note (Signed)
Stable in office today. Some ankle edema, more so in the evenings. Likely due to amlodipine, but could also be due to standing on his feet most of the day. Continue amlodipine at this time. He is to notify me if edema becomes bothersome.

## 2015-05-21 ENCOUNTER — Other Ambulatory Visit: Payer: Self-pay | Admitting: Primary Care

## 2015-05-21 DIAGNOSIS — I10 Essential (primary) hypertension: Secondary | ICD-10-CM

## 2015-05-22 NOTE — Telephone Encounter (Signed)
Electronically refill request for   amLODipine (NORVASC) 10 MG tablet   Take 1 tablet (10 mg total) by mouth daily.  Dispense: 30 tablet   Refills: 3     Last prescribed on 01/25/2015. Last seen on 05/16/2015. CPE on 11/24/2015.

## 2015-05-26 ENCOUNTER — Other Ambulatory Visit (INDEPENDENT_AMBULATORY_CARE_PROVIDER_SITE_OTHER): Payer: BC Managed Care – PPO

## 2015-05-26 DIAGNOSIS — R351 Nocturia: Secondary | ICD-10-CM

## 2015-05-26 DIAGNOSIS — R7303 Prediabetes: Secondary | ICD-10-CM | POA: Diagnosis not present

## 2015-05-26 LAB — PSA: PSA: 3.9 ng/mL (ref 0.10–4.00)

## 2015-05-26 LAB — HEMOGLOBIN A1C: Hgb A1c MFr Bld: 5.9 % (ref 4.6–6.5)

## 2015-08-14 ENCOUNTER — Encounter: Payer: Self-pay | Admitting: Primary Care

## 2015-11-24 ENCOUNTER — Encounter: Payer: BC Managed Care – PPO | Admitting: Primary Care

## 2015-11-28 ENCOUNTER — Ambulatory Visit (INDEPENDENT_AMBULATORY_CARE_PROVIDER_SITE_OTHER): Payer: BC Managed Care – PPO | Admitting: Primary Care

## 2015-11-28 ENCOUNTER — Encounter: Payer: Self-pay | Admitting: Primary Care

## 2015-11-28 VITALS — BP 126/82 | HR 78 | Temp 98.2°F | Ht 68.0 in | Wt 208.8 lb

## 2015-11-28 DIAGNOSIS — I10 Essential (primary) hypertension: Secondary | ICD-10-CM

## 2015-11-28 DIAGNOSIS — Z0000 Encounter for general adult medical examination without abnormal findings: Secondary | ICD-10-CM

## 2015-11-28 DIAGNOSIS — R7303 Prediabetes: Secondary | ICD-10-CM

## 2015-11-28 DIAGNOSIS — Q613 Polycystic kidney, unspecified: Secondary | ICD-10-CM

## 2015-11-28 LAB — COMPREHENSIVE METABOLIC PANEL
ALBUMIN: 4.5 g/dL (ref 3.5–5.2)
ALT: 19 U/L (ref 0–53)
AST: 18 U/L (ref 0–37)
Alkaline Phosphatase: 127 U/L — ABNORMAL HIGH (ref 39–117)
BUN: 15 mg/dL (ref 6–23)
CHLORIDE: 103 meq/L (ref 96–112)
CO2: 26 meq/L (ref 19–32)
CREATININE: 1.2 mg/dL (ref 0.40–1.50)
Calcium: 9.9 mg/dL (ref 8.4–10.5)
GFR: 84.4 mL/min (ref >60.00)
GLUCOSE: 104 mg/dL — AB (ref 70–99)
POTASSIUM: 4.1 meq/L (ref 3.5–5.1)
SODIUM: 138 meq/L (ref 135–145)
Total Bilirubin: 0.6 mg/dL (ref 0.2–1.2)
Total Protein: 7.7 g/dL (ref 6.0–8.3)

## 2015-11-28 LAB — LIPID PANEL
CHOL/HDL RATIO: 3
Cholesterol: 163 mg/dL (ref 0–200)
HDL: 55.4 mg/dL (ref >39.00)
LDL CALC: 98 mg/dL (ref 0–99)
NONHDL: 108.08
Triglycerides: 50 mg/dL (ref 0.0–149.0)
VLDL: 10 mg/dL (ref 0.0–40.0)

## 2015-11-28 LAB — HEMOGLOBIN A1C: HEMOGLOBIN A1C: 5.8 % (ref 4.6–6.5)

## 2015-11-28 NOTE — Assessment & Plan Note (Signed)
Continues to follow with nephrology. BMP pending today.

## 2015-11-28 NOTE — Assessment & Plan Note (Signed)
Blood pressure stable in the office today. Continue amlodipine 10 mg. BMP pending.

## 2015-11-28 NOTE — Assessment & Plan Note (Signed)
Immunizations up-to-date. Overall healthy diet and generally leads a healthy lifestyle. Discussed to increase exercise to 5 days weekly. Exam unremarkable today. Labs pending. Recommended annual eye and dental exams. Follow-up in one year for repeat physical.

## 2015-11-28 NOTE — Patient Instructions (Signed)
Complete lab work prior to leaving today. I will notify you of your results once received.   Continue to work towards a healthy lifestyle through diet and exercise.  Increase consumption of water. Reduce sugary drinks such as kool-aid and sweet tea. Ensure you are consuming 64 ounces of water daily.  Follow up in 1 year for repeat physical or sooner if needed.  It was a pleasure to see you today!  Constipation, Adult Constipation is when a person has fewer than three bowel movements a week, has difficulty having a bowel movement, or has stools that are dry, hard, or larger than normal. As people grow older, constipation is more common. A low-fiber diet, not taking in enough fluids, and taking certain medicines may make constipation worse.  CAUSES   Certain medicines, such as antidepressants, pain medicine, iron supplements, antacids, and water pills.   Certain diseases, such as diabetes, irritable bowel syndrome (IBS), thyroid disease, or depression.   Not drinking enough water.   Not eating enough fiber-rich foods.   Stress or travel.   Lack of physical activity or exercise.   Ignoring the urge to have a bowel movement.   Using laxatives too much.  SIGNS AND SYMPTOMS   Having fewer than three bowel movements a week.   Straining to have a bowel movement.   Having stools that are hard, dry, or larger than normal.   Feeling full or bloated.   Pain in the lower abdomen.   Not feeling relief after having a bowel movement.  DIAGNOSIS  Your health care provider will take a medical history and perform a physical exam. Further testing may be done for severe constipation. Some tests may include:  A barium enema X-ray to examine your rectum, colon, and, sometimes, your small intestine.   A sigmoidoscopy to examine your lower colon.   A colonoscopy to examine your entire colon. TREATMENT  Treatment will depend on the severity of your constipation and what is  causing it. Some dietary treatments include drinking more fluids and eating more fiber-rich foods. Lifestyle treatments may include regular exercise. If these diet and lifestyle recommendations do not help, your health care provider may recommend taking over-the-counter laxative medicines to help you have bowel movements. Prescription medicines may be prescribed if over-the-counter medicines do not work.  HOME CARE INSTRUCTIONS   Eat foods that have a lot of fiber, such as fruits, vegetables, whole grains, and beans.  Limit foods high in fat and processed sugars, such as french fries, hamburgers, cookies, candies, and soda.   A fiber supplement may be added to your diet if you cannot get enough fiber from foods.   Drink enough fluids to keep your urine clear or pale yellow.   Exercise regularly or as directed by your health care provider.   Go to the restroom when you have the urge to go. Do not hold it.   Only take over-the-counter or prescription medicines as directed by your health care provider. Do not take other medicines for constipation without talking to your health care provider first.  [place] IF:   You have bright red blood in your stool.   Your constipation lasts for more than 4 days or gets worse.   You have abdominal or rectal pain.   You have thin, pencil-like stools.   You have unexplained weight loss. MAKE SURE YOU:   Understand these instructions.  Will watch your condition.  Will get help right away if you are not  doing well or get worse.   This information is not intended to replace advice given to you by your health care provider. Make sure you discuss any questions you have with your health care provider.   Document Released: 12/29/2003 Document Revised: 04/22/2014 Document Reviewed: 01/11/2013 Elsevier Interactive Patient Education [employer].

## 2015-11-28 NOTE — Progress Notes (Signed)
Subjective:    Patient ID: Benjamin Griffith, male    DOB: 06/17/1970, 45 y.o.   MRN: [id number]  HPI  Benjamin Griffith is a 45 year old male who presents today for complete physical.  Immunizations: -Tetanus: Completed in 2016 -Influenza: Did not receive last season   Diet: He endorses a fair diet. Breakfast: Skips Lunch: Salad, chicken Dinner: Vegetables, lean meat Snacks: Occasionally, cereal Desserts: Twice weekly Beverages: Water, sweet tea, kool-aid, occasional sodas  Exercise: He works out at [employer] twice weekly Eye exam: Completed several years ago. He has noticed slight changes in his vision.  Dental exam: Has not completed recently   Review of Systems  Constitutional: Negative for unexpected weight change.  HENT: Positive for congestion. Negative for rhinorrhea.   Respiratory: Negative for cough and shortness of breath.   Cardiovascular: Negative for chest pain.  Gastrointestinal: Negative for constipation and diarrhea.  Genitourinary: Negative for difficulty urinating.       Following with nephrology.   Musculoskeletal: Negative for arthralgias and myalgias.  Skin: Negative for rash.  Allergic/Immunologic: Positive for environmental allergies.  Neurological: Negative for dizziness, numbness and headaches.  Psychiatric/Behavioral:       Denies concerns for anxiety or depression       Past Medical History:  Diagnosis Date  . Hypertension      Social History   Social History  . Marital status: Single    Spouse name: N/A  . Number of children: N/A  . Years of education: N/A   Occupational History  . Not on file.   Social History Main Topics  . Smoking status: Never Smoker  . Smokeless tobacco: Not on file  . Alcohol use No  . Drug use: No  . Sexual activity: Not on file   Other Topics Concern  . Not on file   Social History Narrative  . No narrative on file    No past surgical history on file.  Family History  Problem Relation Age of Onset  .  Hypertension Mother   . Diabetes Father   . Hypertension Father   . Kidney disease Maternal Grandmother   . Kidney disease Paternal Grandmother     No Known Allergies  Current Outpatient Prescriptions on File Prior to Visit  Medication Sig Dispense Refill  . amLODipine (NORVASC) 10 MG tablet TAKE 1 TABLET EVERY DAY 90 tablet 2  . cetirizine (ZYRTEC) 10 MG tablet Take 10 mg by mouth daily as needed for allergies.    Marland Kitchen ibuprofen (ADVIL,MOTRIN) 200 MG tablet Take 200 mg by mouth every 6 (six) hours as needed for moderate pain.    . Multiple Vitamin (MULTIVITAMIN WITH MINERALS) TABS tablet Take 1 tablet by mouth daily.    . Omega-3 Fatty Acids (FISH OIL PO) Take 1 capsule by mouth 3 (three) times daily.     No current facility-administered medications on file prior to visit.     BP 126/82   Pulse 78   Temp 98.2 F (36.8 C) (Oral)   Ht 5\' 8"  (1.727 m)   Wt 208 lb 12.8 oz (94.7 kg)   SpO2 98%   BMI 31.75 kg/m    Objective:   Physical Exam  Constitutional: He is oriented to person, place, and time. He appears well-nourished.  HENT:  Right Ear: Tympanic membrane and ear canal normal.  Left Ear: Tympanic membrane and ear canal normal.  Nose: Mucosal edema present. Right sinus exhibits no maxillary sinus tenderness and no frontal sinus tenderness. Left  sinus exhibits no maxillary sinus tenderness and no frontal sinus tenderness.  Mouth/Throat: Oropharynx is clear and moist.  Eyes: Conjunctivae and EOM are normal. Pupils are equal, round, and reactive to light.  Neck: Neck supple. Carotid bruit is not present. No thyromegaly present.  Cardiovascular: Normal rate, regular rhythm and normal heart sounds.   Pulmonary/Chest: Effort normal and breath sounds normal. He has no wheezes. He has no rales.  Abdominal: Soft. Bowel sounds are normal. There is no tenderness.  Musculoskeletal: Normal range of motion.  Neurological: He is alert and oriented to person, place, and time. He has normal  reflexes. No cranial nerve deficit.  Skin: Skin is warm and dry.  Psychiatric: He has a normal mood and affect.          Assessment & Plan:

## 2015-11-28 NOTE — Progress Notes (Signed)
Pre visit review using our clinic review tool, if applicable. No additional management support is needed unless otherwise documented below in the visit note. 

## 2015-11-28 NOTE — Assessment & Plan Note (Signed)
A1c of 5.9 several months ago. Discussed importance of reduction of Kool-Aid and sweet tea. A1c pending today. No evidence of acanthosis nigricans.

## 2016-02-18 ENCOUNTER — Other Ambulatory Visit: Payer: Self-pay | Admitting: Primary Care

## 2016-02-18 DIAGNOSIS — I10 Essential (primary) hypertension: Secondary | ICD-10-CM

## 2016-03-21 ENCOUNTER — Other Ambulatory Visit: Payer: Self-pay | Admitting: Urology

## 2016-03-21 DIAGNOSIS — N281 Cyst of kidney, acquired: Secondary | ICD-10-CM

## 2016-04-01 ENCOUNTER — Ambulatory Visit (HOSPITAL_COMMUNITY): Admission: RE | Admit: 2016-04-01 | Payer: BC Managed Care – PPO | Source: Ambulatory Visit

## 2016-04-05 ENCOUNTER — Ambulatory Visit (HOSPITAL_COMMUNITY)
Admission: RE | Admit: 2016-04-05 | Discharge: 2016-04-05 | Disposition: A | Discharge status: Home / Self Care | Payer: BC Managed Care – PPO | Source: Ambulatory Visit | Attending: Urology | Admitting: Urology

## 2016-04-05 DIAGNOSIS — N281 Cyst of kidney, acquired: Secondary | ICD-10-CM | POA: Insufficient documentation

## 2016-04-05 LAB — POCT I-STAT CREATININE: Creatinine, Ser: 1.3 mg/dL — ABNORMAL HIGH (ref 0.61–1.24)

## 2016-04-05 MED ORDER — GADOBENATE DIMEGLUMINE 529 MG/ML IV SOLN
19.0000 mL | Freq: Once | INTRAVENOUS | Status: AC | PRN
  Administered 2016-04-05: 19 mL via INTRAVENOUS

## 2016-04-11 ENCOUNTER — Encounter: Payer: Self-pay | Admitting: Primary Care

## 2016-04-16 ENCOUNTER — Ambulatory Visit (INDEPENDENT_AMBULATORY_CARE_PROVIDER_SITE_OTHER): Payer: BC Managed Care – PPO | Admitting: Primary Care

## 2016-04-16 ENCOUNTER — Encounter: Payer: Self-pay | Admitting: Primary Care

## 2016-04-16 VITALS — BP 144/96 | HR 74 | Temp 98.4°F | Ht 68.0 in | Wt 211.4 lb

## 2016-04-16 DIAGNOSIS — T148XXA Other injury of unspecified body region, initial encounter: Secondary | ICD-10-CM | POA: Diagnosis not present

## 2016-04-16 NOTE — Progress Notes (Signed)
Subjective:    Patient ID: Benjamin Griffith, male    DOB: 1970-09-25, 46 y.o.   MRN: [id number]  HPI  Mr. Weigman is a 46 year old male with a history of polycystic kidney disease who presents today with a chief complaint of back pain. His pain is located to the left mid back that began 2 weeks ago. He describes his pain as "pulled muscle" that is worse with twisting to his right and left. He's been massaging and applying [place] with improvement. He played basketball Saturday this past weekend and noticed some agitation, but overall is feeling better. He denies injury/trauma, urinary frequency, hematuria, radiation of pain, dysuria. He recently underwent MRI of his kidneys which shows slightly increased size of bilateral cysts.   Review of Systems  Musculoskeletal: Positive for myalgias. Negative for arthralgias.  Skin: Negative for color change.  Neurological: Negative for weakness and numbness.       Past Medical History:  Diagnosis Date  . Hypertension      Social History   Social History  . Marital status: Single    Spouse name: N/A  . Number of children: N/A  . Years of education: N/A   Occupational History  . Not on file.   Social History Main Topics  . Smoking status: Never Smoker  . Smokeless tobacco: Not on file  . Alcohol use No  . Drug use: No  . Sexual activity: Not on file   Other Topics Concern  . Not on file   Social History Narrative  . No narrative on file    No past surgical history on file.  Family History  Problem Relation Age of Onset  . Hypertension Mother   . Diabetes Father   . Hypertension Father   . Kidney disease Maternal Grandmother   . Kidney disease Paternal Grandmother     No Known Allergies  Current Outpatient Prescriptions on File Prior to Visit  Medication Sig Dispense Refill  . amLODipine (NORVASC) 10 MG tablet TAKE 1 TABLET EVERY DAY 90 tablet 1  . cetirizine (ZYRTEC) 10 MG tablet Take 10 mg by mouth daily as needed for  allergies.    Marland Kitchen ibuprofen (ADVIL,MOTRIN) 200 MG tablet Take 200 mg by mouth every 6 (six) hours as needed for moderate pain.    . Multiple Vitamin (MULTIVITAMIN WITH MINERALS) TABS tablet Take 1 tablet by mouth daily.    . Omega-3 Fatty Acids (FISH OIL PO) Take 1 capsule by mouth 3 (three) times daily.     No current facility-administered medications on file prior to visit.     BP (!) 144/96   Pulse 74   Temp 98.4 F (36.9 C) (Oral)   Ht 5\' 8"  (1.727 m)   Wt 211 lb 6.4 oz (95.9 kg)   SpO2 99%   BMI 32.14 kg/m    Objective:   Physical Exam  Constitutional: He appears well-nourished.  Neck: Neck supple.  Cardiovascular: Normal rate and regular rhythm.   Pulmonary/Chest: Effort normal and breath sounds normal.  Abdominal: There is no CVA tenderness.  Musculoskeletal:       Thoracic back: He exhibits normal range of motion, no tenderness, no bony tenderness, no pain and no spasm.  Skin: Skin is warm and dry.          Assessment & Plan:  Muscle strain:  Mid left back pain 2 weeks, overall much improved. Exam today unremarkable. Based off of history of present illness, sounds MSK in nature. Urology  is closely following bilateral renal cysts. Recent renal function stable. No urinary symptoms or suspicion for decreased kidney function. Discussed application of heating pad, OTC icy hot, massage. Discussed to avoid NSAIDs on a regular basis. Follow-up as needed.  Sheral Flow, NP

## 2016-04-16 NOTE — Progress Notes (Signed)
Pre visit review using our clinic review tool, if applicable. No additional management support is needed unless otherwise documented below in the visit note. 

## 2016-04-16 NOTE — Patient Instructions (Signed)
Continue to monitor your blood pressure and report readings at or above 140/90.  Application of heat, Icy Hot, and massage are great ways to help improve a pulled muscle. Please notify me if your pain returns. Use the Aleve sparingly as discussed.  Follow up in August 2018 for your annual physical or sooner if needed.  It was a pleasure to see you today!  DASH Eating Plan DASH stands for "Dietary Approaches to Stop Hypertension." The DASH eating plan is a healthy eating plan that has been shown to reduce high blood pressure (hypertension). Additional health benefits may include reducing the risk of type 2 diabetes mellitus, heart disease, and stroke. The DASH eating plan may also help with weight loss. What do I need to know about the DASH eating plan? For the DASH eating plan, you will follow these general guidelines:  Choose foods with less than 150 milligrams of sodium per serving (as listed on the food label).  Use salt-free seasonings or herbs instead of table salt or sea salt.  Check with your health care provider or pharmacist before using salt substitutes.  Eat lower-sodium products. These are often labeled as "low-sodium" or "no salt added."  Eat fresh foods. Avoid eating a lot of canned foods.  Eat more vegetables, fruits, and low-fat dairy products.  Choose whole grains. Look for the word "whole" as the first word in the ingredient list.  Choose fish and skinless chicken or [place] more often than red meat. Limit fish, poultry, and meat to 6 oz (170 g) each day.  Limit sweets, desserts, sugars, and sugary drinks.  Choose heart-healthy fats.  Eat more home-cooked food and less restaurant, buffet, and fast food.  Limit fried foods.  Do not fry foods. Cook foods using methods such as baking, boiling, grilling, and broiling instead.  When eating at a restaurant, ask that your food be prepared with less salt, or no salt if possible. What foods can I eat? Seek help from a  dietitian for individual calorie needs. Grains  Whole grain or whole wheat bread. Brown rice. Whole grain or whole wheat pasta. Quinoa, bulgur, and whole grain cereals. Low-sodium cereals. Corn or whole wheat flour tortillas. Whole grain cornbread. Whole grain crackers. Low-sodium crackers. Vegetables  Fresh or frozen vegetables (raw, steamed, roasted, or grilled). Low-sodium or reduced-sodium tomato and vegetable juices. Low-sodium or reduced-sodium tomato sauce and paste. Low-sodium or reduced-sodium canned vegetables. Fruits  All fresh, canned (in natural juice), or frozen fruits. Meat and Other Protein Products  Ground beef (85% or leaner), grass-fed beef, or beef trimmed of fat. Skinless chicken or [place]. Ground chicken or [place]. Pork trimmed of fat. All fish and seafood. Eggs. Dried beans, peas, or lentils. Unsalted nuts and seeds. Unsalted canned beans. Dairy  Low-fat dairy products, such as skim or 1% milk, 2% or reduced-fat cheeses, low-fat ricotta or cottage cheese, or plain low-fat yogurt. Low-sodium or reduced-sodium cheeses. Fats and Oils  Tub margarines without trans fats. Light or reduced-fat mayonnaise and salad dressings (reduced sodium). Avocado. Safflower, olive, or canola oils. Natural peanut or almond butter. Other  Unsalted popcorn and pretzels. The items listed above may not be a complete list of recommended foods or beverages. Contact your dietitian for more options.  What foods are not recommended? Grains  White bread. White pasta. White rice. Refined cornbread. Bagels and croissants. Crackers that contain trans fat. Vegetables  Creamed or fried vegetables. Vegetables in a cheese sauce. Regular canned vegetables. Regular canned tomato sauce and paste.  Regular tomato and vegetable juices. Fruits  Canned fruit in light or heavy syrup. Fruit juice. Meat and Other Protein Products  Fatty cuts of meat. Ribs, chicken wings, bacon, sausage, bologna, salami, chitterlings,  fatback, hot dogs, bratwurst, and packaged luncheon meats. Salted nuts and seeds. Canned beans with salt. Dairy  Whole or 2% milk, cream, half-and-half, and cream cheese. Whole-fat or sweetened yogurt. Full-fat cheeses or blue cheese. Nondairy creamers and whipped toppings. Processed cheese, cheese spreads, or cheese curds. Condiments  Onion and garlic salt, seasoned salt, table salt, and sea salt. Canned and packaged gravies. Worcestershire sauce. Tartar sauce. Barbecue sauce. Teriyaki sauce. Soy sauce, including reduced sodium. Steak sauce. Fish sauce. Oyster sauce. Cocktail sauce. Horseradish. Ketchup and mustard. Meat flavorings and tenderizers. Bouillon cubes. Hot sauce. Tabasco sauce. Marinades. Taco seasonings. Relishes. Fats and Oils  Butter, stick margarine, lard, shortening, ghee, and bacon fat. Coconut, palm kernel, or palm oils. Regular salad dressings. Other  Pickles and olives. Salted popcorn and pretzels. The items listed above may not be a complete list of foods and beverages to avoid. Contact your dietitian for more information.  Where can I find more information? National Heart, Lung, and Blood Institute: travelstabloid.com This information is not intended to replace advice given to you by your health care provider. Make sure you discuss any questions you have with your health care provider. Document Released: 03/21/2011 Document Revised: 09/07/2015 Document Reviewed: 02/03/2013 Elsevier Interactive Patient Education  2017 [employer].

## 2016-04-19 ENCOUNTER — Ambulatory Visit: Payer: Self-pay | Admitting: Primary Care

## 2016-08-21 ENCOUNTER — Other Ambulatory Visit: Payer: Self-pay | Admitting: Primary Care

## 2016-08-21 DIAGNOSIS — I10 Essential (primary) hypertension: Secondary | ICD-10-CM

## 2016-09-22 ENCOUNTER — Encounter: Payer: Self-pay | Admitting: Primary Care

## 2016-11-19 ENCOUNTER — Other Ambulatory Visit: Payer: Self-pay | Admitting: Primary Care

## 2016-11-19 DIAGNOSIS — I10 Essential (primary) hypertension: Secondary | ICD-10-CM

## 2016-11-28 ENCOUNTER — Encounter: Payer: BC Managed Care – PPO | Admitting: Primary Care

## 2016-11-29 ENCOUNTER — Encounter: Payer: BC Managed Care – PPO | Admitting: Primary Care

## 2016-12-11 IMAGING — MR MR ABDOMEN W/O CM
4 of 8 series · 24 of 48 positions shown · non-contrast
Comparison: Ultrasound 09/27/2014

CLINICAL DATA: Evaluate renal cysts.

EXAM:
MRI ABDOMEN WITHOUT CONTRAST
TECHNIQUE: Multiplanar multisequence MR imaging was performed without the
administration of intravenous contrast.

[Series 3: T2 fat-sat · axial · 5.0mm · 0.78mm/px · z∈[-43,+192]mm · 6 of 48 slices shown]
[im 1/48]
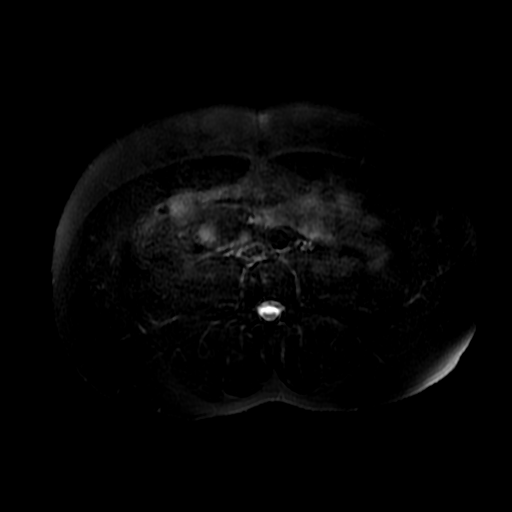
[im 10/48]
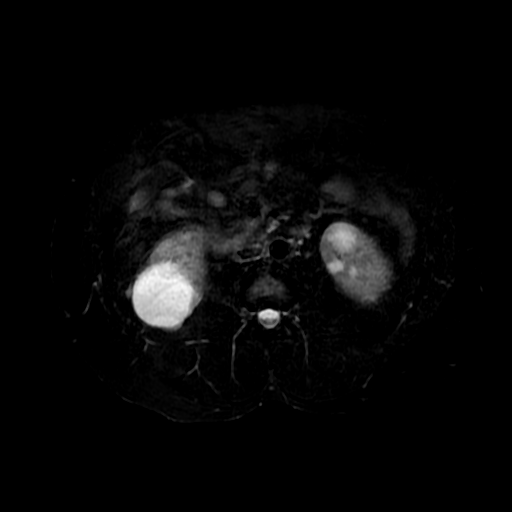
[im 19/48]
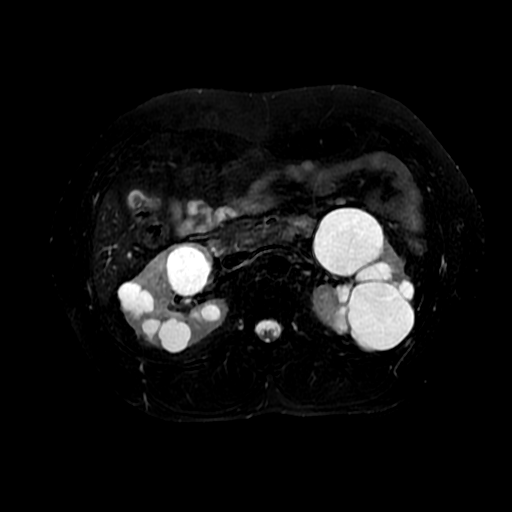
[im 29/48]
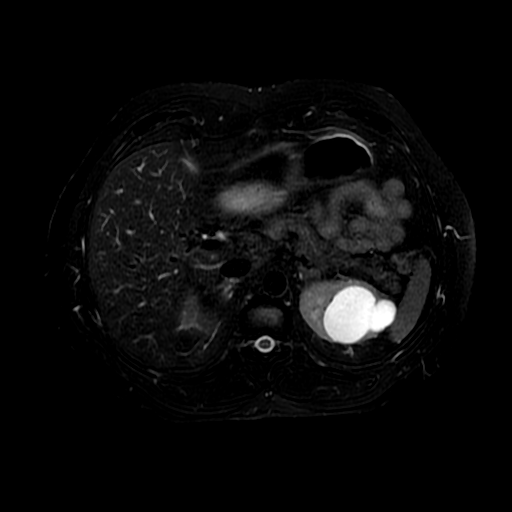
[im 38/48]
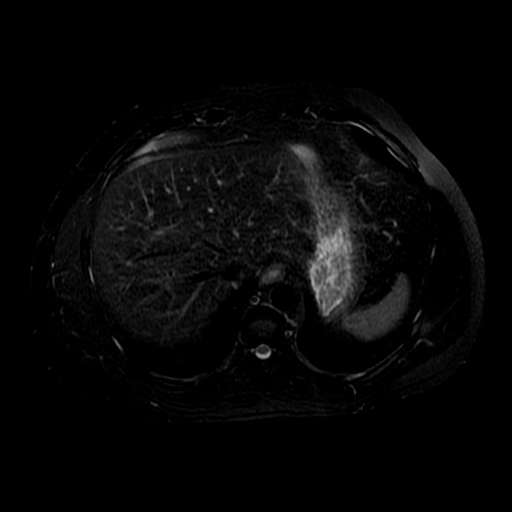
[im 48/48]
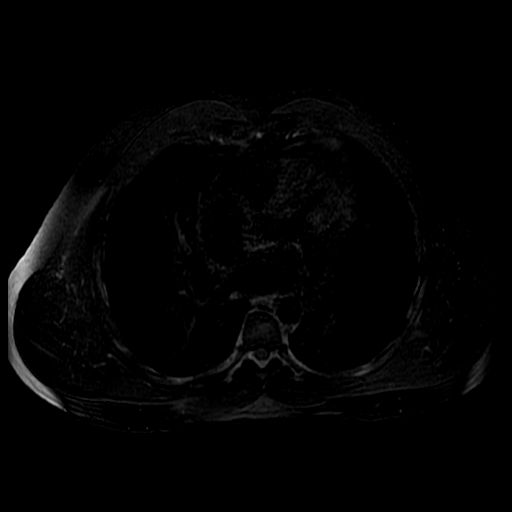

[Series 4: DWI b500 · axial · 6.0mm · 1.48mm/px · z∈[-71,+195]mm · 7 of 70 slices shown]
[im 1/70]
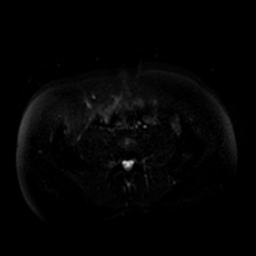
[im 12/70]
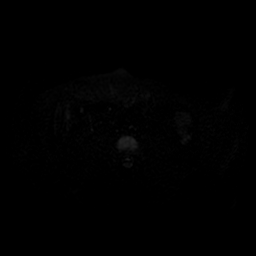
[im 24/70]
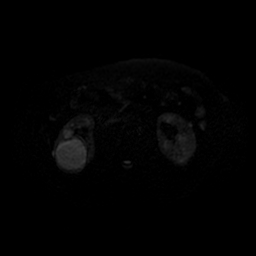
[im 35/70]
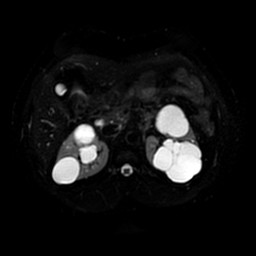
[im 47/70]
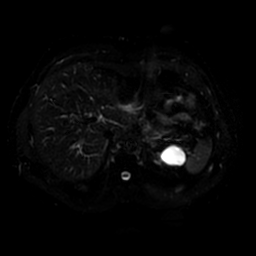
[im 58/70]
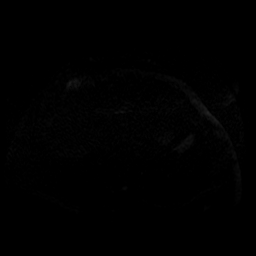
[im 70/70]
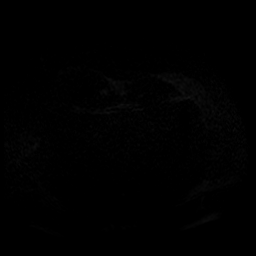

[Series 7: ax dualecho · axial · 5.0mm · 0.78mm/px · z∈[-78,+187]mm · 8 of 108 slices shown]
[im 1/108]
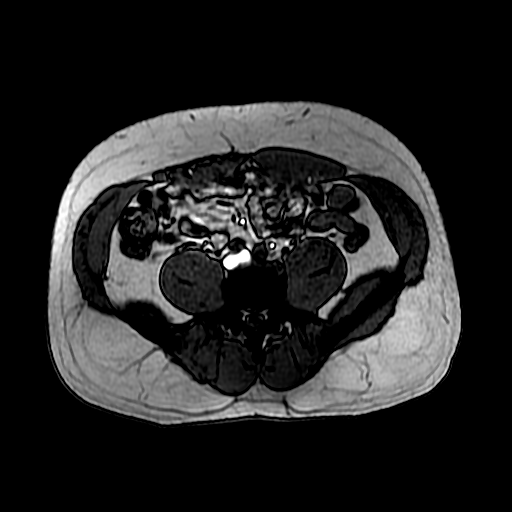
[im 12/108]
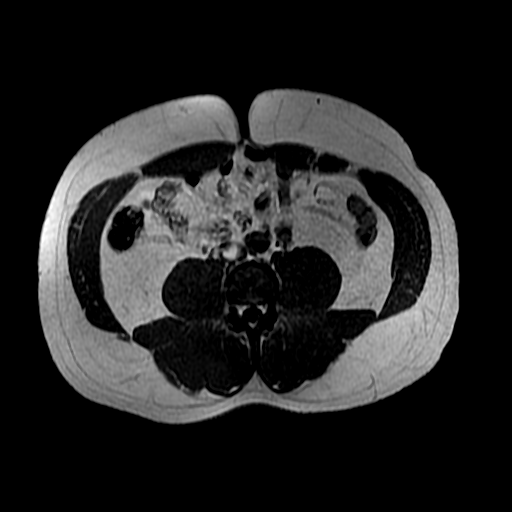
[im 36/108]
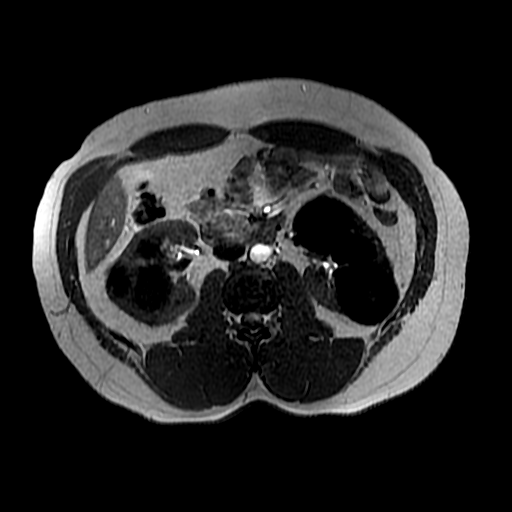
[im 48/108]
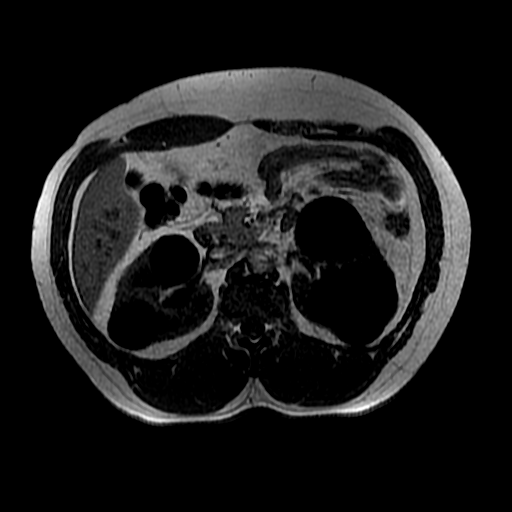
[im 60/108]
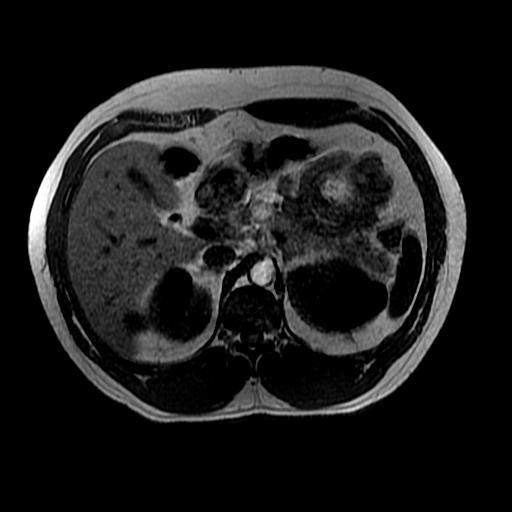
[im 72/108]
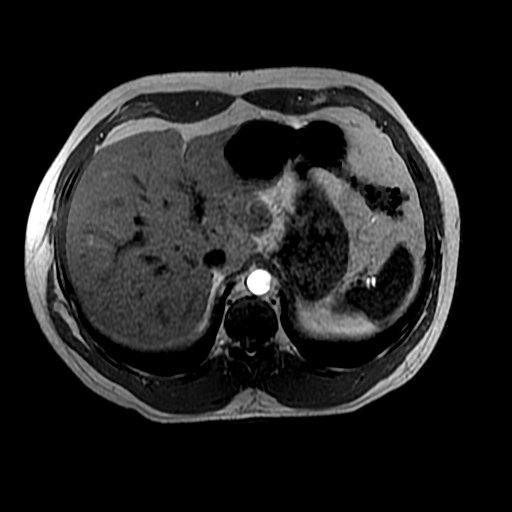
[im 96/108]
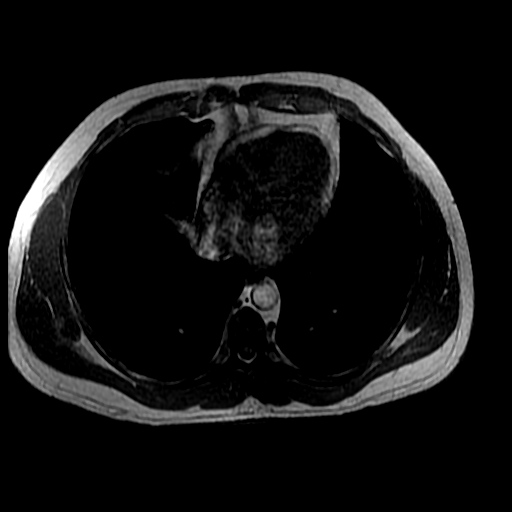
[im 108/108]
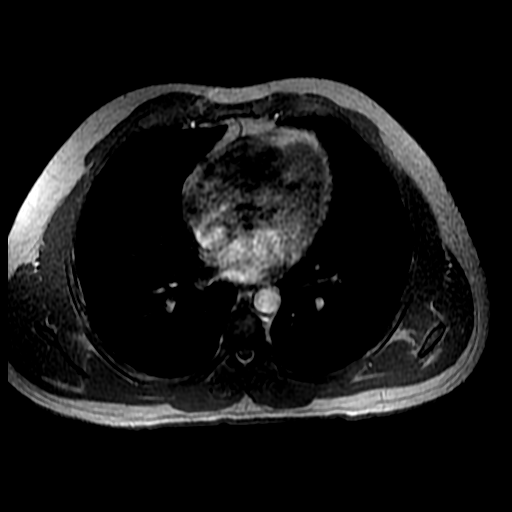

[Series 8: T2 · axial · 5.0mm · 0.78mm/px · z∈[-78,+187]mm · 3 of 54 slices shown]
[im 1/54]
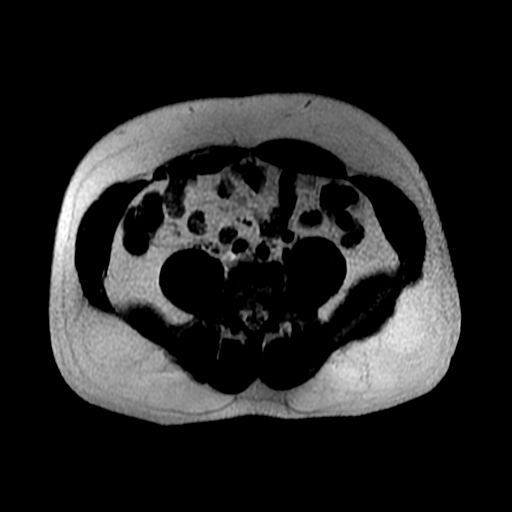
[im 27/54]
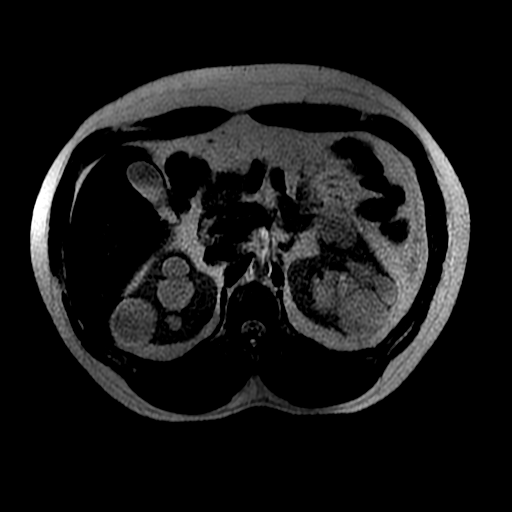
[im 54/54]
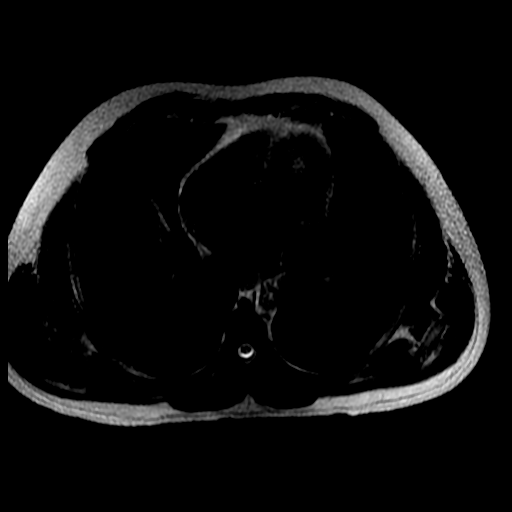

[24 of 48 positions shown; findings below may reference images not displayed]

FINDINGS: Lower chest: Normal heart size without pericardial or pleural
effusion.

Hepatobiliary: Normal liver. Normal gallbladder, without biliary
ductal dilatation.

Pancreas: Normal, without mass or ductal dilatation.

Spleen: Normal

Adrenals/Urinary Tract: Normal adrenal glands.

Multiple cysts are identified within both kidneys. The largest
right-sided lesion measures 4.7 cm in the lower pole. The largest
left-sided lesion is in the interpolar region and measures 6.1 x
cm. Complexity is identified within bilateral cysts. Example thin
septa within the interpolar right-sided lesion on image 34 of series
8. The dominant left-sided lesion demonstrates multiple thin septae,
including on image 31 of series 8 and image 15 of series 9.

No hydronephrosis.

Stomach/Bowel: Normal stomach and abdominal bowel loops.

Vascular/Lymphatic: Normal caliber of the aorta and branch vessels.
No retroperitoneal or retrocrural adenopathy.

Other: No ascites.

Musculoskeletal: No acute osseous abnormality.
IMPRESSION: 1. Multiple bilateral renal cysts. Given the multiplicity and
patient age, adult onset polycystic kidney disease is suspected.
2. Bilateral renal lesions with complexity as evidenced by smooth
septie. These are most consistent with Bosniak 2F lesions. Consider
MRI followup at 6-12 months.

## 2016-12-18 ENCOUNTER — Ambulatory Visit (INDEPENDENT_AMBULATORY_CARE_PROVIDER_SITE_OTHER): Payer: BC Managed Care – PPO | Admitting: Primary Care

## 2016-12-18 ENCOUNTER — Encounter: Payer: Self-pay | Admitting: Primary Care

## 2016-12-18 VITALS — BP 140/84 | HR 68 | Temp 98.3°F | Ht 68.0 in | Wt 215.0 lb

## 2016-12-18 DIAGNOSIS — Q613 Polycystic kidney, unspecified: Secondary | ICD-10-CM | POA: Diagnosis not present

## 2016-12-18 DIAGNOSIS — Z0000 Encounter for general adult medical examination without abnormal findings: Secondary | ICD-10-CM | POA: Diagnosis not present

## 2016-12-18 DIAGNOSIS — I10 Essential (primary) hypertension: Secondary | ICD-10-CM

## 2016-12-18 DIAGNOSIS — R7303 Prediabetes: Secondary | ICD-10-CM

## 2016-12-18 NOTE — Assessment & Plan Note (Signed)
Discussed importance of BP control, he verbalized understanding. Following with nephrology.

## 2016-12-18 NOTE — Assessment & Plan Note (Signed)
Td UTD, declines influenza. Discussed the importance of a healthy diet and regular exercise in order for weight loss, and to reduce the risk of other medical problems. Exam unremarkable.  Labs pending. Will monitor hypertension. Follow up in 1 year.

## 2016-12-18 NOTE — Patient Instructions (Signed)
Continue exercising. You should be getting 150 minutes of moderate intensity exercise weekly.  Ensure you are consuming 64 ounces of water daily.  Increase vegetables, fruit, whole grains, lean protein, water.  Schedule a follow up visit in 3 months for re-evaluation of high blood pressure.  Schedule a lab only appointment to return for fasting labs. No food for 4 hours. You may have water and black coffee.  Follow up in 1 year for your annual exam.  It was a pleasure to see you today!

## 2016-12-18 NOTE — Progress Notes (Signed)
Subjective:    Patient ID: Benjamin Griffith, male    DOB: 06-08-1970, 46 y.o.   MRN: [id number]  HPI  Benjamin Griffith is a 46 year old male who presents today for complete physical.   Immunizations: -Tetanus: Completed in 2016 -Influenza: Due this season, declines.    Diet: He endorses a healthy diet. Breakfast: Boiled egg Lunch: Salad, pork chops, steak, chicken Dinner: Chicken, vegetables Snacks: Pork rinds, dry cereal, ramen noodles, soup Desserts: Occasionally  Beverages: Water, soda, sweet tea  Exercise: Exercises at the gym 3 days weekly. Eye exam: Completed years ago.  Dental exam: Does not complete regularly   Review of Systems  Constitutional: Negative for unexpected weight change.  HENT: Negative for rhinorrhea.   Respiratory: Negative for cough and shortness of breath.   Cardiovascular: Negative for chest pain.  Gastrointestinal: Negative for constipation and diarrhea.  Genitourinary: Negative for difficulty urinating.  Musculoskeletal: Negative for arthralgias and myalgias.  Skin: Negative for rash.  Allergic/Immunologic: Negative for environmental allergies.  Neurological: Negative for dizziness, numbness and headaches.  Psychiatric/Behavioral:       Denies concerns for anxiety or depression       Past Medical History:  Diagnosis Date  . Hypertension      Social History   Social History  . Marital status: Single    Spouse name: N/A  . Number of children: N/A  . Years of education: N/A   Occupational History  . Not on file.   Social History Main Topics  . Smoking status: Never Smoker  . Smokeless tobacco: Never Used  . Alcohol use No  . Drug use: No  . Sexual activity: Not on file   Other Topics Concern  . Not on file   Social History Narrative  . No narrative on file    No past surgical history on file.  Family History  Problem Relation Age of Onset  . Hypertension Mother   . Diabetes Father   . Hypertension Father   . Kidney disease  Maternal Grandmother   . Kidney disease Paternal Grandmother     No Known Allergies  Current Outpatient Prescriptions on File Prior to Visit  Medication Sig Dispense Refill  . amLODipine (NORVASC) 10 MG tablet TAKE 1 TABLET EVERY DAY 90 tablet 1  . cetirizine (ZYRTEC) 10 MG tablet Take 10 mg by mouth daily as needed for allergies.    Marland Kitchen ibuprofen (ADVIL,MOTRIN) 200 MG tablet Take 200 mg by mouth every 6 (six) hours as needed for moderate pain.    . Multiple Vitamin (MULTIVITAMIN WITH MINERALS) TABS tablet Take 1 tablet by mouth daily.    . Omega-3 Fatty Acids (FISH OIL PO) Take 1 capsule by mouth 3 (three) times daily.     No current facility-administered medications on file prior to visit.     BP 140/84   Pulse 68   Temp 98.3 F (36.8 C) (Oral)   Ht 5\' 8"  (1.727 m)   Wt 215 lb (97.5 kg)   SpO2 96%   BMI 32.69 kg/m    Objective:   Physical Exam  Constitutional: He is oriented to person, place, and time. He appears well-nourished.  HENT:  Right Ear: Tympanic membrane and ear canal normal.  Left Ear: Tympanic membrane and ear canal normal.  Nose: Nose normal. Right sinus exhibits no maxillary sinus tenderness and no frontal sinus tenderness. Left sinus exhibits no maxillary sinus tenderness and no frontal sinus tenderness.  Mouth/Throat: Oropharynx is clear and moist.  Eyes:  Pupils are equal, round, and reactive to light. Conjunctivae and EOM are normal.  Neck: Neck supple. Carotid bruit is not present. No thyromegaly present.  Cardiovascular: Normal rate, regular rhythm and normal heart sounds.   Pulmonary/Chest: Effort normal and breath sounds normal. He has no wheezes. He has no rales.  Abdominal: Soft. Bowel sounds are normal. There is no tenderness.  Musculoskeletal: Normal range of motion.  Neurological: He is alert and oriented to person, place, and time. He has normal reflexes. No cranial nerve deficit.  Skin: Skin is warm and dry.  Psychiatric: He has a normal mood  and affect.          Assessment & Plan:

## 2016-12-18 NOTE — Assessment & Plan Note (Addendum)
Endorses home readings of 140's-150's/80-90's at home. Compliant to Amlodipine. Discussed long term effects of uncontrolled hypertension, he would like to work on improvement in diet and regular exercise. Will have him back in 3 months for re-evaluation, if above goal then will add in another medication. He will also monitor and report consistently high readings soon.

## 2016-12-18 NOTE — Assessment & Plan Note (Signed)
Repeat A1C today. Discussed the importance of a healthy diet and regular exercise in order for weight loss, and to reduce the risk of other medical problems.

## 2016-12-20 ENCOUNTER — Other Ambulatory Visit: Payer: Self-pay | Admitting: Primary Care

## 2016-12-23 ENCOUNTER — Other Ambulatory Visit: Payer: BC Managed Care – PPO

## 2016-12-26 ENCOUNTER — Other Ambulatory Visit (INDEPENDENT_AMBULATORY_CARE_PROVIDER_SITE_OTHER): Payer: BC Managed Care – PPO

## 2016-12-26 DIAGNOSIS — I10 Essential (primary) hypertension: Secondary | ICD-10-CM | POA: Diagnosis not present

## 2016-12-26 DIAGNOSIS — R7303 Prediabetes: Secondary | ICD-10-CM

## 2016-12-26 DIAGNOSIS — Z0000 Encounter for general adult medical examination without abnormal findings: Secondary | ICD-10-CM | POA: Diagnosis not present

## 2016-12-26 LAB — HEMOGLOBIN A1C: Hgb A1c MFr Bld: 5.9 % (ref 4.6–6.5)

## 2016-12-27 LAB — LIPID PANEL
CHOL/HDL RATIO: 3
Cholesterol: 147 mg/dL (ref 0–200)
HDL: 56.6 mg/dL (ref >39.00)
LDL Cholesterol: 80 mg/dL (ref 0–99)
NONHDL: 90.12
Triglycerides: 51 mg/dL (ref 0.0–149.0)
VLDL: 10.2 mg/dL (ref 0.0–40.0)

## 2016-12-27 LAB — COMPREHENSIVE METABOLIC PANEL
ALT: 16 U/L (ref 0–53)
AST: 18 U/L (ref 0–37)
Albumin: 4.6 g/dL (ref 3.5–5.2)
Alkaline Phosphatase: 129 U/L — ABNORMAL HIGH (ref 39–117)
BILIRUBIN TOTAL: 0.4 mg/dL (ref 0.2–1.2)
BUN: 17 mg/dL (ref 6–23)
CHLORIDE: 103 meq/L (ref 96–112)
CO2: 25 meq/L (ref 19–32)
CREATININE: 1.42 mg/dL (ref 0.40–1.50)
Calcium: 9.8 mg/dL (ref 8.4–10.5)
GFR: 69.16 mL/min (ref >60.00)
GLUCOSE: 93 mg/dL (ref 70–99)
Potassium: 4.1 mEq/L (ref 3.5–5.1)
Sodium: 140 mEq/L (ref 135–145)
Total Protein: 7.9 g/dL (ref 6.0–8.3)

## 2017-01-27 ENCOUNTER — Encounter: Payer: Self-pay | Admitting: Primary Care

## 2017-01-27 DIAGNOSIS — I10 Essential (primary) hypertension: Secondary | ICD-10-CM

## 2017-01-31 MED ORDER — HYDROCHLOROTHIAZIDE 25 MG PO TABS: 25.0000 mg | ORAL_TABLET | Freq: Every day | ORAL | 0 refills | Status: DC

## 2017-01-31 NOTE — Telephone Encounter (Signed)
Please set patient up for BP recheck in 2 weeks with Benjamin Griffith since [name] be out of town.

## 2017-01-31 NOTE — Telephone Encounter (Signed)
Message left for patient to return my call.  

## 2017-02-11 ENCOUNTER — Encounter: Payer: Self-pay | Admitting: Primary Care

## 2017-02-14 ENCOUNTER — Encounter: Payer: Self-pay | Admitting: Internal Medicine

## 2017-02-14 ENCOUNTER — Ambulatory Visit (INDEPENDENT_AMBULATORY_CARE_PROVIDER_SITE_OTHER): Payer: BC Managed Care – PPO | Admitting: Internal Medicine

## 2017-02-14 VITALS — BP 126/92 | HR 78 | Wt 204.0 lb

## 2017-02-14 DIAGNOSIS — I10 Essential (primary) hypertension: Secondary | ICD-10-CM | POA: Diagnosis not present

## 2017-02-14 DIAGNOSIS — Q613 Polycystic kidney, unspecified: Secondary | ICD-10-CM

## 2017-02-14 LAB — COMPREHENSIVE METABOLIC PANEL
ALK PHOS: 126 U/L — AB (ref 39–117)
ALT: 17 U/L (ref 0–53)
AST: 18 U/L (ref 0–37)
Albumin: 4.7 g/dL (ref 3.5–5.2)
BILIRUBIN TOTAL: 0.5 mg/dL (ref 0.2–1.2)
BUN: 20 mg/dL (ref 6–23)
CALCIUM: 10.1 mg/dL (ref 8.4–10.5)
CO2: 33 meq/L — AB (ref 19–32)
CREATININE: 1.51 mg/dL — AB (ref 0.40–1.50)
Chloride: 98 mEq/L (ref 96–112)
GFR: 64.38 mL/min (ref >60.00)
Glucose, Bld: 96 mg/dL (ref 70–99)
Potassium: 3.9 mEq/L (ref 3.5–5.1)
Sodium: 139 mEq/L (ref 135–145)
TOTAL PROTEIN: 8 g/dL (ref 6.0–8.3)

## 2017-02-14 MED ORDER — AMLODIPINE BESY-BENAZEPRIL HCL 10-20 MG PO CAPS: 1.0000 | ORAL_CAPSULE | Freq: Every day | ORAL | 0 refills | Status: DC

## 2017-02-14 NOTE — Patient Instructions (Signed)

## 2017-02-14 NOTE — Assessment & Plan Note (Signed)
CMET today Continue to follow with nephrology

## 2017-02-14 NOTE — Assessment & Plan Note (Signed)
BP fine, I advised him not to monitor his BP at home with his cuff He can have it checked by the school RN or go to the fire department and they will check it for him Stop HCTZ Change Amlodipine to Amlodipine-Benzepril 10-20 mg daily Discussed DASH diet and exercise for weight loss CMET today  RTC in 2 weeks for BP check

## 2017-02-14 NOTE — Progress Notes (Signed)
Subjective:    Patient ID: Benjamin Griffith, male    DOB: 1971-03-05, 46 y.o.   MRN: [id number]  HPI  Pt presents to the clinic today for follow up of HTN. He has been managed on Amlodpine, but recently started on HCTZ secondary to continue elevated blood pressure. He reports he has not noticed any significant improvements in his blood pressure, and since starting the HCTZ, he has been having issues with headaches and lightheadedness. The headaches are located in his temples. He describes the pain as pressure. He denies visual changes. He describes the dizziness as a sense of imbalance, not that the room is spinning. He also feels like this medication is causing some ED issues, which he has never experienced before. His BP today is 126/92. He has not had a CMET since starting on the HCTZ. ECG from 01/2015 reviewed. He does report that he only eats 1 time a day, and has not been drinking more water since starting on HCTZ. He does have polycystic kidney disease and is following with nephrology.  Review of Systems  Past Medical History:  Diagnosis Date  . Hypertension     Current Outpatient Prescriptions  Medication Sig Dispense Refill  . amLODipine (NORVASC) 10 MG tablet TAKE 1 TABLET EVERY DAY 90 tablet 1  . cetirizine (ZYRTEC) 10 MG tablet Take 10 mg by mouth daily as needed for allergies.    . hydrochlorothiazide (HYDRODIURIL) 25 MG tablet Take 1 tablet (25 mg total) by mouth daily. 30 tablet 0  . ibuprofen (ADVIL,MOTRIN) 200 MG tablet Take 200 mg by mouth every 6 (six) hours as needed for moderate pain.    . Multiple Vitamin (MULTIVITAMIN WITH MINERALS) TABS tablet Take 1 tablet by mouth daily.    . Omega-3 Fatty Acids (FISH OIL PO) Take 1 capsule by mouth 3 (three) times daily.     No current facility-administered medications for this visit.     No Known Allergies  Family History  Problem Relation Age of Onset  . Hypertension Mother   . Diabetes Father   . Hypertension Father   .  Kidney disease Maternal Grandmother   . Kidney disease Paternal Grandmother     Social History   Social History  . Marital status: Single    Spouse name: N/A  . Number of children: N/A  . Years of education: N/A   Occupational History  . Not on file.   Social History Main Topics  . Smoking status: Never Smoker  . Smokeless tobacco: Never Used  . Alcohol use No  . Drug use: No  . Sexual activity: Not on file   Other Topics Concern  . Not on file   Social History Narrative  . No narrative on file     Constitutional: Pt reports headaches. Denies fever, malaise, fatigue, or abrupt weight changes.  Respiratory: Denies difficulty breathing, shortness of breath, cough or sputum production.   Cardiovascular: Denies chest pain, chest tightness, palpitations or swelling in the hands or feet.  Neurological: Pt reports intermittent dizziness. Denies difficulty with memory, difficulty with speech or problems with balance and coordination.    No other specific complaints in a complete review of systems (except as listed in HPI above).     Objective:   Physical Exam   BP (!) 126/92   Pulse 78   Resp (!) 98   Wt 204 lb (92.5 kg)   BMI 31.02 kg/m  Wt Readings from Last 3 Encounters:  02/14/17 204 lb (92.5  kg)  12/18/16 215 lb (97.5 kg)  04/16/16 211 lb 6.4 oz (95.9 kg)    General: Appears his stated age, obese in NAD. Cardiovascular: Normal rate and rhythm. S1,S2 noted.  No murmur, rubs or gallops noted. Pulmonary/Chest: Normal effort and positive vesicular breath sounds. No respiratory distress. No wheezes, rales or ronchi noted.  Neurological: Alert and oriented.  Coordination normal.    BMET    Component Value Date/Time   NA 140 12/26/2016 1434   K 4.1 12/26/2016 1434   CL 103 12/26/2016 1434   CO2 25 12/26/2016 1434   GLUCOSE 93 12/26/2016 1434   BUN 17 12/26/2016 1434   CREATININE 1.42 12/26/2016 1434   CALCIUM 9.8 12/26/2016 1434   GFRNONAA >60 01/19/2015  1533   GFRAA >60 01/19/2015 1533    Lipid Panel     Component Value Date/Time   CHOL 147 12/26/2016 1434   TRIG 51.0 12/26/2016 1434   HDL 56.60 12/26/2016 1434   CHOLHDL 3 12/26/2016 1434   VLDL 10.2 12/26/2016 1434   LDLCALC 80 12/26/2016 1434    CBC    Component Value Date/Time   WBC 8.2 01/19/2015 1533   RBC 5.09 01/19/2015 1533   HGB 15.5 01/19/2015 1533   HCT 46.1 01/19/2015 1533   PLT 364 01/19/2015 1533   MCV 90.6 01/19/2015 1533   MCH 30.5 01/19/2015 1533   MCHC 33.6 01/19/2015 1533   RDW 13.1 01/19/2015 1533    Hgb A1C Lab Results  Component Value Date   HGBA1C 5.9 12/26/2016           Assessment & Plan:

## 2017-02-18 ENCOUNTER — Encounter: Payer: Self-pay | Admitting: Primary Care

## 2017-02-18 ENCOUNTER — Telehealth: Payer: Self-pay | Admitting: Primary Care

## 2017-02-18 NOTE — Progress Notes (Signed)
Results for lab (CMET) given to pt.

## 2017-02-18 NOTE — Telephone Encounter (Signed)
Pt called. Given results of labwork (CMET).

## 2017-02-21 ENCOUNTER — Ambulatory Visit: Payer: BC Managed Care – PPO | Admitting: Internal Medicine

## 2017-02-24 ENCOUNTER — Encounter: Payer: Self-pay | Admitting: Primary Care

## 2017-03-03 ENCOUNTER — Encounter: Payer: Self-pay | Admitting: Primary Care

## 2017-03-03 ENCOUNTER — Ambulatory Visit: Payer: BC Managed Care – PPO | Admitting: Primary Care

## 2017-03-03 VITALS — BP 118/82 | HR 69 | Temp 98.9°F | Ht 68.0 in | Wt 207.8 lb

## 2017-03-03 DIAGNOSIS — I10 Essential (primary) hypertension: Secondary | ICD-10-CM | POA: Diagnosis not present

## 2017-03-03 DIAGNOSIS — R6882 Decreased libido: Secondary | ICD-10-CM | POA: Diagnosis not present

## 2017-03-03 NOTE — Assessment & Plan Note (Signed)
Improved on amlodipine-benazepril. Does seem to have ED side effects. Discussed options for treatment he will think about this and message [place] with his response.

## 2017-03-03 NOTE — Patient Instructions (Signed)
Complete lab work prior to leaving today. I will notify you of your results once received.   It was a pleasure to see you today! Happy Thanksgiving!   Erectile Dysfunction Erectile dysfunction (ED) is the inability to get or keep an erection in order to have sexual intercourse. Erectile dysfunction may include:  Inability to get an erection.  Lack of enough hardness of the erection to allow penetration.  Loss of the erection before sex is finished.  What are the causes? This condition may be caused by:  Certain medicines, such as: ? Pain relievers. ? Antihistamines. ? Antidepressants. ? Blood pressure medicines. ? Water pills (diuretics). ? Ulcer medicines. ? Muscle relaxants. ? Drugs.  Excessive drinking.  Psychological causes, such as: ? Anxiety. ? Depression. ? Sadness. ? Exhaustion. ? Performance fear. ? Stress.  Physical causes, such as: ? Artery problems. This may include diabetes, smoking, liver disease, or atherosclerosis. ? High blood pressure. ? Hormonal problems, such as low testosterone. ? Obesity. ? Nerve problems. This may include back or pelvic injuries, diabetes mellitus, multiple sclerosis, or Parkinson disease.  What are the signs or symptoms? Symptoms of this condition include:  Inability to get an erection.  Lack of enough hardness of the erection to allow penetration.  Loss of the erection before sex is finished.  Normal erections at some times, but with frequent unsatisfactory episodes.  Low sexual satisfaction in either partner due to erection problems.  A curved penis occurring with erection. The curve may cause pain or the penis may be too curved to allow for intercourse.  Never having nighttime erections.  How is this diagnosed? This condition is often diagnosed by:  Performing a physical exam to find other diseases or specific problems with the penis.  Asking you detailed questions about the problem.  Performing blood  tests to check for diabetes mellitus or to measure hormone levels.  Performing other tests to check for underlying health conditions.  Performing an ultrasound exam to check for scarring.  Performing a test to check blood flow to the penis.  Doing a sleep study at home to measure nighttime erections.  How is this treated? This condition may be treated by:  Medicine taken by mouth to help you achieve an erection (oral medicine).  Hormone replacement therapy to replace low testosterone levels.  Medicine that is injected into the penis. Your health care provider may instruct you how to give yourself these injections at home.  Vacuum pump. This is a pump with a ring on it. The pump and ring are placed on the penis and used to create pressure that helps the penis become erect.  Penile implant surgery. In this procedure, you may receive: ? An inflatable implant. This consists of cylinders, a pump, and a reservoir. The cylinders can be inflated with a fluid that helps to create an erection, and they can be deflated after intercourse. ? A semi-rigid implant. This consists of two silicone rubber rods. The rods provide some rigidity. They are also flexible, so the penis can both curve downward in its normal position and become straight for sexual intercourse.  Blood vessel surgery, to improve blood flow to the penis. During this procedure, a blood vessel from a different part of the body is placed into the penis to allow blood to flow around (bypass) damaged or blocked blood vessels.  Lifestyle changes, such as exercising more, losing weight, and quitting smoking.  Follow these instructions at home: Medicines  Take over-the-counter and prescription medicines  only as told by your health care provider. Do not increase the dosage without first discussing it with your health care provider.  If you are using self-injections, perform injections as directed by your health care provider. Make sure to  avoid any veins that are on the surface of the penis. After giving an injection, apply pressure to the injection site for 5 minutes. General instructions  Exercise regularly, as directed by your health care provider. Work with your health care provider to lose weight, if needed.  Do not use any products that contain nicotine or tobacco, such as cigarettes and e-cigarettes. If you need help quitting, ask your health care provider.  Before using a vacuum pump, read the instructions that come with the pump and discuss any questions with your health care provider.  Keep all follow-up visits as told by your health care provider. This is important. Contact a health care provider if:  You feel nauseous.  You vomit. Get help right away if:  You are taking oral or injectable medicines and you have an erection that lasts longer than 4 hours. If your health care provider is unavailable, go to the nearest emergency room for evaluation. An erection that lasts much longer than 4 hours can result in permanent damage to your penis.  You have severe pain in your groin or abdomen.  You develop redness or severe swelling of your penis.  You have redness spreading up into your groin or lower abdomen.  You are unable to urinate.  You experience chest pain or a rapid heart beat (palpitations) after taking oral medicines. Summary  Erectile dysfunction (ED) is the inability to get or keep an erection during sexual intercourse. This problem can usually be treated successfully.  This condition is diagnosed based on a physical exam, your symptoms, and tests to determine the cause. Treatment varies depending on the cause, and may include medicines, hormone therapy, surgery, or vacuum pump.  You may need follow-up visits to make sure that you are using your medicines or devices correctly.  Get help right away if you are taking or injecting medicines and you have an erection that lasts longer than 4  hours. This information is not intended to replace advice given to you by your health care provider. Make sure you discuss any questions you have with your health care provider. Document Released: 03/29/2000 Document Revised: 04/17/2016 Document Reviewed: 04/17/2016 Elsevier Interactive Patient Education  2017 [employer].

## 2017-03-03 NOTE — Assessment & Plan Note (Signed)
Likely secondary to BP meds, could also be secondary to prediabetes. Offered to check testosterone levels, he will think about this. Also discussed treatment for lack of erections, he will think about this as well.

## 2017-03-03 NOTE — Progress Notes (Signed)
Subjective:    Patient ID: Benjamin Griffith, male    DOB: [birth year], 46 y.o.   MRN: [id number]  HPI  Mr. Gavigan is a 46 year old male who presents today for follow up for hypertension.  Currently managed on amlodipine-benazepril 10-20 mg which was initiated on 02/14/17 given persistent elevated readings. Previously managed on Amlodipine with evidence of elevated readings, but wanted to work on lifestyle changes.  BP Readings from Last 3 Encounters:  03/03/17 118/82  02/14/17 (!) 126/92  12/18/16 140/84   Since his last visit he's compliant to amlodipine-benazepril. He's checking his BP at home and is getting readings of 130's/80's. He denies chest pain, dizziness, headaches.   He has noticed decrease in sexual drive, fatigue, difficulty obtaining and maintaining an erection that has been present since he's started taking anti-hypertensive medication.    Review of Systems  Eyes: Negative for visual disturbance.  Respiratory: Negative for shortness of breath.   Cardiovascular: Negative for chest pain.  Neurological: Negative for dizziness and headaches.       Past Medical History:  Diagnosis Date  . Hypertension      Social History   Socioeconomic History  . Marital status: Single    Spouse name: Not on file  . Number of children: Not on file  . Years of education: Not on file  . Highest education level: Not on file  Social Needs  . Financial resource strain: Not on file  . Food insecurity - worry: Not on file  . Food insecurity - inability: Not on file  . Transportation needs - medical: Not on file  . Transportation needs - non-medical: Not on file  Occupational History  . Not on file  Tobacco Use  . Smoking status: Never Smoker  . Smokeless tobacco: Never Used  Substance and Sexual Activity  . Alcohol use: No    Alcohol/week: 0.0 oz  . Drug use: No  . Sexual activity: Not on file  Other Topics Concern  . Not on file  Social History Narrative  . Not on file     No past surgical history on file.  Family History  Problem Relation Age of Onset  . Hypertension Mother   . Diabetes Father   . Hypertension Father   . Kidney disease Maternal Grandmother   . Kidney disease Paternal Grandmother     No Known Allergies  Current Outpatient Medications on File Prior to Visit  Medication Sig Dispense Refill  . amLODipine-benazepril (LOTREL) 10-20 MG capsule Take 1 capsule by mouth daily. 30 capsule 0  . cetirizine (ZYRTEC) 10 MG tablet Take 10 mg by mouth daily as needed for allergies.    Marland Kitchen ibuprofen (ADVIL,MOTRIN) 200 MG tablet Take 200 mg by mouth every 6 (six) hours as needed for moderate pain.    . Multiple Vitamin (MULTIVITAMIN WITH MINERALS) TABS tablet Take 1 tablet by mouth daily.    . Omega-3 Fatty Acids (FISH OIL PO) Take 1 capsule by mouth 3 (three) times daily.     No current facility-administered medications on file prior to visit.     BP 118/82   Pulse 69   Temp 98.9 F (37.2 C) (Oral)   Ht 5\' 8"  (1.727 m)   Wt 207 lb 12.8 oz (94.3 kg)   SpO2 98%   BMI 31.60 kg/m    Objective:   Physical Exam  Constitutional: He appears well-nourished.  Neck: Neck supple.  Cardiovascular: Normal rate and regular rhythm.  Pulmonary/Chest: Effort normal and  breath sounds normal.  Skin: Skin is warm and dry.          Assessment & Plan:

## 2017-03-04 LAB — BASIC METABOLIC PANEL
BUN: 14 mg/dL (ref 6–23)
CHLORIDE: 103 meq/L (ref 96–112)
CO2: 31 meq/L (ref 19–32)
CREATININE: 1.21 mg/dL (ref 0.40–1.50)
Calcium: 10 mg/dL (ref 8.4–10.5)
GFR: 83.12 mL/min (ref >60.00)
Glucose, Bld: 94 mg/dL (ref 70–99)
Potassium: 4.2 mEq/L (ref 3.5–5.1)
Sodium: 140 mEq/L (ref 135–145)

## 2017-03-05 ENCOUNTER — Encounter: Payer: Self-pay | Admitting: Primary Care

## 2017-03-05 DIAGNOSIS — R6882 Decreased libido: Secondary | ICD-10-CM

## 2017-03-05 MED ORDER — TADALAFIL 5 MG PO TABS: ORAL_TABLET | ORAL | 0 refills | Status: DC

## 2017-03-17 ENCOUNTER — Other Ambulatory Visit: Payer: Self-pay | Admitting: Internal Medicine

## 2017-08-04 ENCOUNTER — Encounter: Payer: Self-pay | Admitting: Primary Care

## 2017-08-06 ENCOUNTER — Encounter: Payer: Self-pay | Admitting: Primary Care

## 2017-08-06 ENCOUNTER — Ambulatory Visit: Payer: BC Managed Care – PPO | Admitting: Primary Care

## 2017-08-06 VITALS — BP 138/94 | HR 57 | Temp 98.2°F | Ht 68.0 in | Wt 207.5 lb

## 2017-08-06 DIAGNOSIS — S50861A Insect bite (nonvenomous) of right forearm, initial encounter: Secondary | ICD-10-CM | POA: Diagnosis not present

## 2017-08-06 DIAGNOSIS — W57XXXA Bitten or stung by nonvenomous insect and other nonvenomous arthropods, initial encounter: Secondary | ICD-10-CM

## 2017-08-06 NOTE — Patient Instructions (Addendum)
Everything looks fine today. The firmness is likely a Hematoma or bruise under the skin. Watch it for now and if it gets any worse or bigger please come in for a follow-up. Please follow-up as well if you develop any fevers, chills, body aches, redness around the site, increased fatigue.   Wear bug spray when outdoors, especially in wooded areas.   Continue to monitor you home blood pressure and report readings that are at or above 135/90 consistently.   Please schedule a physical with me in August this year.  It was a pleasure to see you today!    DASH Eating Plan DASH stands for "Dietary Approaches to Stop Hypertension." The DASH eating plan is a healthy eating plan that has been shown to reduce high blood pressure (hypertension). It may also reduce your risk for type 2 diabetes, heart disease, and stroke. The DASH eating plan may also help with weight loss. What are tips for following this plan? General guidelines  Avoid eating more than 2,300 mg (milligrams) of salt (sodium) a day. If you have hypertension, you may need to reduce your sodium intake to 1,500 mg a day.  Limit alcohol intake to no more than 1 drink a day for nonpregnant women and 2 drinks a day for men. One drink equals 12 oz of beer, 5 oz of wine, or 1 oz of hard liquor.  Work with your health care provider to maintain a healthy body weight or to lose weight. Ask what an ideal weight is for you.  Get at least 30 minutes of exercise that causes your heart to beat faster (aerobic exercise) most days of the week. Activities may include walking, swimming, or biking.  Work with your health care provider or diet and nutrition specialist (dietitian) to adjust your eating plan to your individual calorie needs. Reading food labels  Check food labels for the amount of sodium per serving. Choose foods with less than 5 percent of the Daily Value of sodium. Generally, foods with less than 300 mg of sodium per serving fit into this  eating plan.  To find whole grains, look for the word "whole" as the first word in the ingredient list. Shopping  Buy products labeled as "low-sodium" or "no salt added."  Buy fresh foods. Avoid canned foods and premade or frozen meals. Cooking  Avoid adding salt when cooking. Use salt-free seasonings or herbs instead of table salt or sea salt. Check with your health care provider or pharmacist before using salt substitutes.  Do not fry foods. Cook foods using healthy methods such as baking, boiling, grilling, and broiling instead.  Cook with heart-healthy oils, such as olive, canola, soybean, or sunflower oil. Meal planning   Eat a balanced diet that includes: ? 5 or more servings of fruits and vegetables each day. At each meal, try to fill half of your plate with fruits and vegetables. ? Up to 6-8 servings of whole grains each day. ? Less than 6 oz of lean meat, poultry, or fish each day. A 3-oz serving of meat is about the same size as a deck of cards. One egg equals 1 oz. ? 2 servings of low-fat dairy each day. ? A serving of nuts, seeds, or beans 5 times each week. ? Heart-healthy fats. Healthy fats called Omega-3 fatty acids are found in foods such as flaxseeds and coldwater fish, like sardines, salmon, and mackerel.  Limit how much you eat of the following: ? Canned or prepackaged foods. ? Food that  is high in trans fat, such as fried foods. ? Food that is high in saturated fat, such as fatty meat. ? Sweets, desserts, sugary drinks, and other foods with added sugar. ? Full-fat dairy products.  Do not salt foods before eating.  Try to eat at least 2 vegetarian meals each week.  Eat more home-cooked food and less restaurant, buffet, and fast food.  When eating at a restaurant, ask that your food be prepared with less salt or no salt, if possible. What foods are recommended? The items listed may not be a complete list. Talk with your dietitian about what dietary choices  are best for you. Grains Whole-grain or whole-wheat bread. Whole-grain or whole-wheat pasta. Brown rice. [place]. Bulgur. Whole-grain and low-sodium cereals. Pita bread. Low-fat, low-sodium crackers. Whole-wheat flour tortillas. Vegetables Fresh or frozen vegetables (raw, steamed, roasted, or grilled). Low-sodium or reduced-sodium tomato and vegetable juice. Low-sodium or reduced-sodium tomato sauce and tomato paste. Low-sodium or reduced-sodium canned vegetables. Fruits All fresh, dried, or frozen fruit. Canned fruit in natural juice (without added sugar). Meat and other protein foods Skinless chicken or [place]. Ground chicken or [place]. Pork with fat trimmed off. Fish and seafood. Egg whites. Dried beans, peas, or lentils. Unsalted nuts, nut butters, and seeds. Unsalted canned beans. Lean cuts of beef with fat trimmed off. Low-sodium, lean deli meat. Dairy Low-fat (1%) or fat-free (skim) milk. Fat-free, low-fat, or reduced-fat cheeses. Nonfat, low-sodium ricotta or cottage cheese. Low-fat or nonfat yogurt. Low-fat, low-sodium cheese. Fats and oils Soft margarine without trans fats. Vegetable oil. Low-fat, reduced-fat, or light mayonnaise and salad dressings (reduced-sodium). Canola, safflower, olive, soybean, and sunflower oils. Avocado. Seasoning and other foods Herbs. Spices. Seasoning mixes without salt. Unsalted popcorn and pretzels. Fat-free sweets. What foods are not recommended? The items listed may not be a complete list. Talk with your dietitian about what dietary choices are best for you. Grains Baked goods made with fat, such as croissants, muffins, or some breads. Dry pasta or rice meal packs. Vegetables Creamed or fried vegetables. Vegetables in a cheese sauce. Regular canned vegetables (not low-sodium or reduced-sodium). Regular canned tomato sauce and paste (not low-sodium or reduced-sodium). Regular tomato and vegetable juice (not low-sodium or reduced-sodium). Angie Fava.  Olives. Fruits Canned fruit in a light or heavy syrup. Fried fruit. Fruit in cream or butter sauce. Meat and other protein foods Fatty cuts of meat. Ribs. Fried meat. Berniece Salines. Sausage. Bologna and other processed lunch meats. Salami. Fatback. Hotdogs. Bratwurst. Salted nuts and seeds. Canned beans with added salt. Canned or smoked fish. Whole eggs or egg yolks. Chicken or [place] with skin. Dairy Whole or 2% milk, cream, and half-and-half. Whole or full-fat cream cheese. Whole-fat or sweetened yogurt. Full-fat cheese. Nondairy creamers. Whipped toppings. Processed cheese and cheese spreads. Fats and oils Butter. Stick margarine. Lard. Shortening. Ghee. Bacon fat. Tropical oils, such as coconut, palm kernel, or palm oil. Seasoning and other foods Salted popcorn and pretzels. Onion salt, garlic salt, seasoned salt, table salt, and sea salt. Worcestershire sauce. Tartar sauce. Barbecue sauce. Teriyaki sauce. Soy sauce, including reduced-sodium. Steak sauce. Canned and packaged gravies. Fish sauce. Oyster sauce. Cocktail sauce. Horseradish that you find on the shelf. Ketchup. Mustard. Meat flavorings and tenderizers. Bouillon cubes. Hot sauce and Tabasco sauce. Premade or packaged marinades. Premade or packaged taco seasonings. Relishes. Regular salad dressings. Where to find more information:  National Heart, Lung, and [place]: https://wilson-eaton.com/  American Heart Association: www.heart.org Summary  The DASH eating plan is a healthy  eating plan that has been shown to reduce high blood pressure (hypertension). It may also reduce your risk for type 2 diabetes, heart disease, and stroke.  With the DASH eating plan, you should limit salt (sodium) intake to 2,300 mg a day. If you have hypertension, you may need to reduce your sodium intake to 1,500 mg a day.  When on the DASH eating plan, aim to eat more fresh fruits and vegetables, whole grains, lean proteins, low-fat dairy, and heart-healthy  fats.  Work with your health care provider or diet and nutrition specialist (dietitian) to adjust your eating plan to your individual calorie needs. This information is not intended to replace advice given to you by your health care provider. Make sure you discuss any questions you have with your health care provider. Document Released: 03/21/2011 Document Revised: 03/25/2016 Document Reviewed: 03/25/2016 Elsevier Interactive Patient Education  [employer].

## 2017-08-06 NOTE — Progress Notes (Signed)
Subjective:    Patient ID: Benjamin Griffith, male    DOB: Jul 28, 1970, 47 y.o.   MRN: [id number]  HPI Benjamin Griffith is a 47 y.o. male who presents today with a CC of Right arm swelling. Noticed swelling 4 days ago after returning from kid's field trip at [name] and were outdoors most of the time. Does report it getting warm and tender 3 days ago. Denies using any big spray during trip. A pustle formed the next day and he tried popping it and had some pus drain. Since then he has only put neosporin on it. Denies any fever/chills/ or body aches. He states that he thinks it is getting better  He is hypertensive in office today and reports home readings   Review of Systems  Constitutional: Negative for activity change and fatigue.  Musculoskeletal: Negative for arthralgias, back pain, myalgias and neck pain.  Skin: Positive for color change.      Past Medical History:  Diagnosis Date  . Hypertension    No past surgical history on file. Social History   Socioeconomic History  . Marital status: Single    Spouse name: Not on file  . Number of children: Not on file  . Years of education: Not on file  . Highest education level: Not on file  Occupational History  . Not on file  Social Needs  . Financial resource strain: Not on file  . Food insecurity:    Worry: Not on file    Inability: Not on file  . Transportation needs:    Medical: Not on file    Non-medical: Not on file  Tobacco Use  . Smoking status: Never Smoker  . Smokeless tobacco: Never Used  Substance and Sexual Activity  . Alcohol use: No    Alcohol/week: 0.0 oz  . Drug use: No  . Sexual activity: Not on file  Lifestyle  . Physical activity:    Days per week: Not on file    Minutes per session: Not on file  . Stress: Not on file  Relationships  . Social connections:    Talks on phone: Not on file    Gets together: Not on file    Attends religious service: Not on file    Active member of club or organization:  Not on file    Attends meetings of clubs or organizations: Not on file    Relationship status: Not on file  . Intimate partner violence:    Fear of current or ex partner: Not on file    Emotionally abused: Not on file    Physically abused: Not on file    Forced sexual activity: Not on file  Other Topics Concern  . Not on file  Social History Narrative  . Not on file   Family History  Problem Relation Age of Onset  . Hypertension Mother   . Diabetes Father   . Hypertension Father   . Kidney disease Maternal Grandmother   . Kidney disease Paternal Grandmother    Current Outpatient Medications on File Prior to Visit  Medication Sig Dispense Refill  . amLODipine-benazepril (LOTREL) 10-20 MG capsule TAKE 1 CAPSULE BY MOUTH EVERY DAY 90 capsule 1  . cetirizine (ZYRTEC) 10 MG tablet Take 10 mg by mouth daily as needed for allergies.    Marland Kitchen ibuprofen (ADVIL,MOTRIN) 200 MG tablet Take 200 mg by mouth every 6 (six) hours as needed for moderate pain.    . Multiple Vitamin (MULTIVITAMIN WITH MINERALS) TABS tablet  Take 1 tablet by mouth daily.    . Omega-3 Fatty Acids (FISH OIL PO) Take 1 capsule by mouth 3 (three) times daily.    . tadalafil (CIALIS) 5 MG tablet Take 1 tablet by mouth 30 minutes prior to intercourse as needed for erectile dysfunction. 10 tablet 0   No current facility-administered medications on file prior to visit.      Objective:   Physical Exam  Constitutional: He appears well-nourished. No distress.  Skin: Skin is warm, dry and intact. No rash noted. Rash is not pustular. There is erythema.       BP (!) 138/94 (BP Location: Left Arm, Patient Position: Sitting, Cuff Size: Normal)   Pulse (!) 57   Temp 98.2 F (36.8 C) (Oral)   Ht 5\' 8"  (1.727 m)   Wt 207 lb 8 oz (94.1 kg)   SpO2 98%   BMI 31.55 kg/m      Assessment & Plan:   1. Insect bite of right forearm, initial encounter - Watch for now, follow-up precautions given - Bug Spray education  given  Denita Lung, RN, Adult-Geriatric Nurse Practitioner Student

## 2017-08-06 NOTE — Progress Notes (Signed)
Subjective:    Patient ID: Benjamin Griffith, male    DOB: Jul 08, 1970, 47 y.o.   MRN: [id number]  HPI  Benjamin Griffith is a 47 year old male who presents today with a chief complaint of upper extremity swelling.   The swelling is located to the right medial mid forearm that he first noticed 4 days ago when returning from camping. He was not using bug spray. He did notice a pustule and popped it with clear drainage. He noticed residual swelling under the site that has improved over the last several days. He denies fevers, chills, body aches, erythema. He's been applying neosporin to the site.   Review of Systems  Constitutional: Negative for fever.  Musculoskeletal: Negative for arthralgias.  Skin: Negative for color change.       Right medial mid forearm swelling       Past Medical History:  Diagnosis Date  . Hypertension      Social History   Socioeconomic History  . Marital status: Single    Spouse name: Not on file  . Number of children: Not on file  . Years of education: Not on file  . Highest education level: Not on file  Occupational History  . Not on file  Social Needs  . Financial resource strain: Not on file  . Food insecurity:    Worry: Not on file    Inability: Not on file  . Transportation needs:    Medical: Not on file    Non-medical: Not on file  Tobacco Use  . Smoking status: Never Smoker  . Smokeless tobacco: Never Used  Substance and Sexual Activity  . Alcohol use: No    Alcohol/week: 0.0 oz  . Drug use: No  . Sexual activity: Not on file  Lifestyle  . Physical activity:    Days per week: Not on file    Minutes per session: Not on file  . Stress: Not on file  Relationships  . Social connections:    Talks on phone: Not on file    Gets together: Not on file    Attends religious service: Not on file    Active member of club or organization: Not on file    Attends meetings of clubs or organizations: Not on file    Relationship status: Not on file  .  Intimate partner violence:    Fear of current or ex partner: Not on file    Emotionally abused: Not on file    Physically abused: Not on file    Forced sexual activity: Not on file  Other Topics Concern  . Not on file  Social History Narrative  . Not on file    No past surgical history on file.  Family History  Problem Relation Age of Onset  . Hypertension Mother   . Diabetes Father   . Hypertension Father   . Kidney disease Maternal Grandmother   . Kidney disease Paternal Grandmother     No Known Allergies  Current Outpatient Medications on File Prior to Visit  Medication Sig Dispense Refill  . amLODipine-benazepril (LOTREL) 10-20 MG capsule TAKE 1 CAPSULE BY MOUTH EVERY DAY 90 capsule 1  . cetirizine (ZYRTEC) 10 MG tablet Take 10 mg by mouth daily as needed for allergies.    Marland Kitchen ibuprofen (ADVIL,MOTRIN) 200 MG tablet Take 200 mg by mouth every 6 (six) hours as needed for moderate pain.    . Multiple Vitamin (MULTIVITAMIN WITH MINERALS) TABS tablet Take 1 tablet by mouth  daily.    . Omega-3 Fatty Acids (FISH OIL PO) Take 1 capsule by mouth 3 (three) times daily.     No current facility-administered medications on file prior to visit.     BP (!) 138/94 (BP Location: Left Arm, Patient Position: Sitting, Cuff Size: Normal)   Pulse (!) 57   Temp 98.2 F (36.8 C) (Oral)   Ht 5\' 8"  (1.727 m)   Wt 207 lb 8 oz (94.1 kg)   SpO2 98%   BMI 31.55 kg/m    Objective:   Physical Exam  Constitutional: He appears well-nourished.  Cardiovascular: Normal rate.  Pulmonary/Chest: Effort normal.  Skin: Skin is warm and dry. No erythema.  2 mm circular well healing scab to right mid medial forearm. Superficial area of firm tissue measuring 2cmX3cm. No erythema or tenderness.          Assessment & Plan:  Insect Bite:  Located to right mid medial forearm after camping. Doesn't appear to be Lyme Disease, RMSF, cellulitis. There is no warmth, tenderness, or erythema to the  side. Recommended to wear bug spray when camping. Discussed to monitor site and report erythema, tenderness, pain.  Overall appears to be healing well.  Pleas Koch, NP

## 2017-09-11 ENCOUNTER — Other Ambulatory Visit: Payer: Self-pay | Admitting: Primary Care

## 2017-10-01 ENCOUNTER — Encounter: Payer: Self-pay | Admitting: Primary Care

## 2017-12-22 ENCOUNTER — Encounter: Payer: BC Managed Care – PPO | Admitting: Primary Care

## 2017-12-22 ENCOUNTER — Ambulatory Visit (INDEPENDENT_AMBULATORY_CARE_PROVIDER_SITE_OTHER): Payer: BC Managed Care – PPO | Admitting: Primary Care

## 2017-12-22 ENCOUNTER — Encounter: Payer: Self-pay | Admitting: Primary Care

## 2017-12-22 VITALS — BP 122/82 | HR 71 | Temp 98.1°F | Ht 68.0 in | Wt 205.0 lb

## 2017-12-22 DIAGNOSIS — Z0000 Encounter for general adult medical examination without abnormal findings: Secondary | ICD-10-CM | POA: Diagnosis not present

## 2017-12-22 DIAGNOSIS — L309 Dermatitis, unspecified: Secondary | ICD-10-CM

## 2017-12-22 DIAGNOSIS — I10 Essential (primary) hypertension: Secondary | ICD-10-CM

## 2017-12-22 DIAGNOSIS — R7303 Prediabetes: Secondary | ICD-10-CM

## 2017-12-22 DIAGNOSIS — Q613 Polycystic kidney, unspecified: Secondary | ICD-10-CM

## 2017-12-22 NOTE — Assessment & Plan Note (Signed)
Intermittent flares during seasonal changes. Not using OTC products.

## 2017-12-22 NOTE — Assessment & Plan Note (Signed)
Following with nephrology. CMP pending today.

## 2017-12-22 NOTE — Progress Notes (Signed)
Subjective:    Patient ID: Benjamin Griffith, male    DOB: Nov 28, 1970, 47 y.o.   MRN: [id number]  HPI  Benjamin Griffith is a 47 year old male who presents today for complete physical.  BP Readings from Last 3 Encounters:  12/22/17 122/82  08/06/17 (!) 138/94  03/03/17 118/82     Immunizations: -Tetanus: Completed in 2016 -Influenza: Declines     Diet: He endorses a fair diet Breakfast: Eggs, fruit Lunch: Skips mostly  Dinner: Salad, vegetables, chicken, rice Snacks: None Desserts: Twice weekly  Beverages: Water, juice, soda  Exercise: He is working out at [employer] three days weekly  Eye exam: No recent exam Dental exam: No recent exam   Review of Systems  Constitutional: Negative for unexpected weight change.  HENT: Negative for rhinorrhea.   Respiratory: Negative for cough and shortness of breath.   Cardiovascular: Negative for chest pain.  Gastrointestinal: Negative for constipation and diarrhea.  Genitourinary: Negative for difficulty urinating.  Musculoskeletal: Negative for arthralgias and myalgias.  Skin:       Intermittent rash from eczema  Allergic/Immunologic: Negative for environmental allergies.  Neurological: Negative for dizziness, numbness and headaches.  Psychiatric/Behavioral:       Stress at work, [employer] well his own.        Past Medical History:  Diagnosis Date  . Hypertension   . Polycystic kidney disease   . Prediabetes      Social History   Socioeconomic History  . Marital status: Single    Spouse name: Not on file  . Number of children: Not on file  . Years of education: Not on file  . Highest education level: Not on file  Occupational History  . Not on file  Social Needs  . Financial resource strain: Not on file  . Food insecurity:    Worry: Not on file    Inability: Not on file  . Transportation needs:    Medical: Not on file    Non-medical: Not on file  Tobacco Use  . Smoking status: Never Smoker  . Smokeless tobacco:  Never Used  Substance and Sexual Activity  . Alcohol use: No    Alcohol/week: 0.0 standard drinks  . Drug use: No  . Sexual activity: Not on file  Lifestyle  . Physical activity:    Days per week: Not on file    Minutes per session: Not on file  . Stress: Not on file  Relationships  . Social connections:    Talks on phone: Not on file    Gets together: Not on file    Attends religious service: Not on file    Active member of club or organization: Not on file    Attends meetings of clubs or organizations: Not on file    Relationship status: Not on file  . Intimate partner violence:    Fear of current or ex partner: Not on file    Emotionally abused: Not on file    Physically abused: Not on file    Forced sexual activity: Not on file  Other Topics Concern  . Not on file  Social History Narrative  . Not on file    History reviewed. No pertinent surgical history.  Family History  Problem Relation Age of Onset  . Hypertension Mother   . Diabetes Father   . Hypertension Father   . Kidney disease Maternal Grandmother   . Kidney disease Paternal Grandmother     No Known Allergies  Current  Outpatient Medications on File Prior to Visit  Medication Sig Dispense Refill  . amLODipine-benazepril (LOTREL) 10-20 MG capsule TAKE 1 CAPSULE BY MOUTH EVERY DAY 90 capsule 1  . cetirizine (ZYRTEC) 10 MG tablet Take 10 mg by mouth daily as needed for allergies.    Marland Kitchen ibuprofen (ADVIL,MOTRIN) 200 MG tablet Take 200 mg by mouth every 6 (six) hours as needed for moderate pain.    . Multiple Vitamin (MULTIVITAMIN WITH MINERALS) TABS tablet Take 1 tablet by mouth daily.    . Omega-3 Fatty Acids (FISH OIL PO) Take 1 capsule by mouth 3 (three) times daily.     No current facility-administered medications on file prior to visit.     BP 122/82   Pulse 71   Temp 98.1 F (36.7 C) (Oral)   Ht 5\' 8"  (1.727 m)   Wt 205 lb (93 kg)   SpO2 98%   BMI 31.17 kg/m    Objective:   Physical Exam    Constitutional: He is oriented to person, place, and time. He appears well-nourished.  HENT:  Mouth/Throat: No oropharyngeal exudate.  Eyes: Pupils are equal, round, and reactive to light. EOM are normal.  Neck: Neck supple. No thyromegaly present.  Cardiovascular: Normal rate and regular rhythm.  Respiratory: Effort normal and breath sounds normal.  GI: Soft. Bowel sounds are normal. There is no tenderness.  Musculoskeletal: Normal range of motion.  Neurological: He is alert and oriented to person, place, and time.  Skin: Skin is warm and dry.  Psychiatric: He has a normal mood and affect.           Assessment & Plan:

## 2017-12-22 NOTE — Assessment & Plan Note (Signed)
Td UTD, declines influenza vaccination. Commended him on regular exercise, discussed to work on diet.  Exam unremarkable. Labs pending. Follow up in 1 year for CPE.

## 2017-12-22 NOTE — Patient Instructions (Signed)
Stop by the lab prior to leaving today. I will notify you of your results once received.   Continue exercising. You should be getting 150 minutes of moderate intensity exercise weekly.  Be sure to eat plenty of vegetables, fruit, whole grains, lean protein.   Ensure you are consuming 64 ounces of water daily.  Follow up in 1 year for your annual exam or sooner if needed.  It was a pleasure to see you today!   Preventive Care 40-64 Years, Male Preventive care refers to lifestyle choices and visits with your health care provider that can promote health and wellness. What does preventive care include?  A yearly physical exam. This is also called an annual well check.  Dental exams once or twice a year.  Routine eye exams. Ask your health care provider how often you should have your eyes checked.  Personal lifestyle choices, including: ? Daily care of your teeth and gums. ? Regular physical activity. ? Eating a healthy diet. ? Avoiding tobacco and drug use. ? Limiting alcohol use. ? Practicing safe sex. ? Taking low-dose aspirin every day starting at age 71. What happens during an annual well check? The services and screenings done by your health care provider during your annual well check will depend on your age, overall health, lifestyle risk factors, and family history of disease. Counseling Your health care provider may ask you questions about your:  Alcohol use.  Tobacco use.  Drug use.  Emotional well-being.  Home and relationship well-being.  Sexual activity.  Eating habits.  Work and work Statistician.  Screening You may have the following tests or measurements:  Height, weight, and BMI.  Blood pressure.  Lipid and cholesterol levels. These may be checked every 5 years, or more frequently if you are over 72 years old.  Skin check.  Lung cancer screening. You may have this screening every year starting at age 26 if you have a 30-pack-year history of  smoking and currently smoke or have quit within the past 15 years.  Fecal occult blood test (FOBT) of the stool. You may have this test every year starting at age 59.  Flexible sigmoidoscopy or colonoscopy. You may have a sigmoidoscopy every 5 years or a colonoscopy every 10 years starting at age 71.  Prostate cancer screening. Recommendations will vary depending on your family history and other risks.  Hepatitis C blood test.  Hepatitis B blood test.  Sexually transmitted disease (STD) testing.  Diabetes screening. This is done by checking your blood sugar (glucose) after you have not eaten for a while (fasting). You may have this done every 1-3 years.  Discuss your test results, treatment options, and if necessary, the need for more tests with your health care provider. Vaccines Your health care provider may recommend certain vaccines, such as:  Influenza vaccine. This is recommended every year.  Tetanus, diphtheria, and acellular pertussis (Tdap, Td) vaccine. You may need a Td booster every 10 years.  Varicella vaccine. You may need this if you have not been vaccinated.  Zoster vaccine. You may need this after age 58.  Measles, mumps, and rubella (MMR) vaccine. You may need at least one dose of MMR if you were born in 1957 or later. You may also need a second dose.  Pneumococcal 13-valent conjugate (PCV13) vaccine. You may need this if you have certain conditions and have not been vaccinated.  Pneumococcal polysaccharide (PPSV23) vaccine. You may need one or two doses if you smoke cigarettes or if  you have certain conditions.  Meningococcal vaccine. You may need this if you have certain conditions.  Hepatitis A vaccine. You may need this if you have certain conditions or if you travel or work in places where you may be exposed to hepatitis A.  Hepatitis B vaccine. You may need this if you have certain conditions or if you travel or work in places where you may be exposed to  hepatitis B.  Haemophilus influenzae type b (Hib) vaccine. You may need this if you have certain risk factors.  Talk to your health care provider about which screenings and vaccines you need and how often you need them. This information is not intended to replace advice given to you by your health care provider. Make sure you discuss any questions you have with your health care provider. Document Released: 04/28/2015 Document Revised: 12/20/2015 Document Reviewed: 01/31/2015 Elsevier Interactive Patient Education  [employer].

## 2017-12-22 NOTE — Assessment & Plan Note (Signed)
Fair diet, is exercising. Commended him on this. Repeat A1C pending. Continue to monitor.

## 2017-12-22 NOTE — Assessment & Plan Note (Signed)
Stable in the office today, continue current regimen. BMP pending. 

## 2017-12-23 LAB — COMPREHENSIVE METABOLIC PANEL
ALBUMIN: 4.4 g/dL (ref 3.5–5.2)
ALK PHOS: 106 U/L (ref 39–117)
ALT: 14 U/L (ref 0–53)
AST: 17 U/L (ref 0–37)
BILIRUBIN TOTAL: 0.4 mg/dL (ref 0.2–1.2)
BUN: 15 mg/dL (ref 6–23)
CO2: 29 mEq/L (ref 19–32)
Calcium: 9.5 mg/dL (ref 8.4–10.5)
Chloride: 104 mEq/L (ref 96–112)
Creatinine, Ser: 1.29 mg/dL (ref 0.40–1.50)
GFR: 76.92 mL/min (ref >60.00)
GLUCOSE: 84 mg/dL (ref 70–99)
Potassium: 3.8 mEq/L (ref 3.5–5.1)
Sodium: 141 mEq/L (ref 135–145)
TOTAL PROTEIN: 7.5 g/dL (ref 6.0–8.3)

## 2017-12-23 LAB — LIPID PANEL
CHOLESTEROL: 139 mg/dL (ref 0–200)
HDL: 50 mg/dL (ref >39.00)
LDL Cholesterol: 76 mg/dL (ref 0–99)
NonHDL: 88.75
Total CHOL/HDL Ratio: 3
Triglycerides: 64 mg/dL (ref 0.0–149.0)
VLDL: 12.8 mg/dL (ref 0.0–40.0)

## 2017-12-23 LAB — HEMOGLOBIN A1C: HEMOGLOBIN A1C: 6 % (ref 4.6–6.5)

## 2018-03-16 ENCOUNTER — Other Ambulatory Visit: Payer: Self-pay | Admitting: Primary Care

## 2018-07-07 ENCOUNTER — Ambulatory Visit: Payer: BC Managed Care – PPO | Admitting: Primary Care

## 2018-07-07 ENCOUNTER — Encounter: Payer: Self-pay | Admitting: Primary Care

## 2018-07-07 ENCOUNTER — Other Ambulatory Visit: Payer: Self-pay

## 2018-07-07 VITALS — BP 140/94 | HR 65 | Temp 98.2°F | Ht 68.0 in | Wt 210.2 lb

## 2018-07-07 DIAGNOSIS — R7303 Prediabetes: Secondary | ICD-10-CM

## 2018-07-07 DIAGNOSIS — I10 Essential (primary) hypertension: Secondary | ICD-10-CM

## 2018-07-07 LAB — POCT GLYCOSYLATED HEMOGLOBIN (HGB A1C): Hemoglobin A1C: 6 % — AB (ref 4.0–5.6)

## 2018-07-07 NOTE — Assessment & Plan Note (Signed)
Repeat A1C pending. Encouraged to work on changing his diet, resume regular exercise.

## 2018-07-07 NOTE — Assessment & Plan Note (Signed)
Not taking medication as prescribed, no medication in four days. Discussed to start taking amlodipine-benazepril daily as prescribed, recommended he set an alarm to remember.   Will not adjust BP medication today. He will start monitoring BP daily at home. We will set up phone visit for 2 weeks for BP check. If above goal on prescribed regimen then we will consider increasing dose of medication.

## 2018-07-07 NOTE — Progress Notes (Signed)
Subjective:    Patient ID: Benjamin Griffith, male    DOB: [birth year], 48 y.o.   MRN: [id number]  HPI  Benjamin Griffith is a 48 year old male who presents today with a chief complaint of hypertension. He is also due for some repeat lab work.  1) Essential Hypertension: Currently managed on amlodipine-benazepril 10-20 mg. He was last evaluated in September 2019 for CPE and BP was well controlled.   He endorses that he's not taken his BP medication consistently over the last one week, missed two days total. He's not checking his BP regularly, two weeks ago he checked his BP at home which was 135/90. He has not taken his medication in four days.   BP Readings from Last 3 Encounters:  07/07/18 (!) 140/94  12/22/17 122/82  08/06/17 (!) 138/94   2) Prediabetes: Chronic over the years. A1C of 6.0 in September 2019.  Diet currently consists of:  Breakfast: Skips Lunch: Fried chicken, chicken wings, hamburgers Dinner: Chips, salad with protein, vegetable, starch Snacks: Cereal, chips Desserts: Four days weekly  Beverages: Sweet tea, soda, water, juice  Exercise: He is not exercising much now that the gym is closed. Does some push ups and sit ups at home.   Review of Systems  Eyes: Negative for visual disturbance.  Respiratory: Negative for shortness of breath.   Cardiovascular: Negative for chest pain.  Neurological: Positive for headaches. Negative for dizziness and numbness.       Past Medical History:  Diagnosis Date  . Hypertension   . Polycystic kidney disease   . Prediabetes      Social History   Socioeconomic History  . Marital status: Single    Spouse name: Not on file  . Number of children: Not on file  . Years of education: Not on file  . Highest education level: Not on file  Occupational History  . Not on file  Social Needs  . Financial resource strain: Not on file  . Food insecurity:    Worry: Not on file    Inability: Not on file  . Transportation needs:   Medical: Not on file    Non-medical: Not on file  Tobacco Use  . Smoking status: Never Smoker  . Smokeless tobacco: Never Used  Substance and Sexual Activity  . Alcohol use: No    Alcohol/week: 0.0 standard drinks  . Drug use: No  . Sexual activity: Not on file  Lifestyle  . Physical activity:    Days per week: Not on file    Minutes per session: Not on file  . Stress: Not on file  Relationships  . Social connections:    Talks on phone: Not on file    Gets together: Not on file    Attends religious service: Not on file    Active member of club or organization: Not on file    Attends meetings of clubs or organizations: Not on file    Relationship status: Not on file  . Intimate partner violence:    Fear of current or ex partner: Not on file    Emotionally abused: Not on file    Physically abused: Not on file    Forced sexual activity: Not on file  Other Topics Concern  . Not on file  Social History Narrative  . Not on file    No past surgical history on file.  Family History  Problem Relation Age of Onset  . Hypertension Mother   . Diabetes Father   .  Hypertension Father   . Kidney disease Maternal Grandmother   . Kidney disease Paternal Grandmother     No Known Allergies  Current Outpatient Medications on File Prior to Visit  Medication Sig Dispense Refill  . amLODipine-benazepril (LOTREL) 10-20 MG capsule TAKE 1 CAPSULE BY MOUTH EVERY DAY 90 capsule 1  . cetirizine (ZYRTEC) 10 MG tablet Take 10 mg by mouth daily as needed for allergies.    Marland Kitchen ibuprofen (ADVIL,MOTRIN) 200 MG tablet Take 200 mg by mouth every 6 (six) hours as needed for moderate pain.    . Multiple Vitamin (MULTIVITAMIN WITH MINERALS) TABS tablet Take 1 tablet by mouth daily.    . Omega-3 Fatty Acids (FISH OIL PO) Take 1 capsule by mouth 3 (three) times daily.     No current facility-administered medications on file prior to visit.     BP (!) 140/94   Pulse 65   Temp 98.2 F (36.8 C) (Oral)    Ht 5\' 8"  (1.727 m)   Wt 210 lb 4 oz (95.4 kg)   SpO2 98%   BMI 31.97 kg/m    Objective:   Physical Exam  Constitutional: He appears well-nourished.  Neck: Neck supple.  Cardiovascular: Normal rate and regular rhythm.  Respiratory: Effort normal and breath sounds normal.  Skin: Skin is warm and dry.           Assessment & Plan:

## 2018-07-07 NOTE — Patient Instructions (Addendum)
Stop by the lab prior to leaving today. I will notify you of your results once received.   You MUST take your blood pressure medication everyday.   Start monitoring your blood pressure daily, around the same time of day, for the next 2-3 weeks.  Ensure that you have rested for 30 minutes prior to checking your blood pressure. Record your readings and we will call you for a phone visit to recheck.   Schedule a phone visit for 2 weeks for BP check.  It was a pleasure to see you today!

## 2018-07-21 ENCOUNTER — Other Ambulatory Visit: Payer: Self-pay

## 2018-07-21 ENCOUNTER — Ambulatory Visit (INDEPENDENT_AMBULATORY_CARE_PROVIDER_SITE_OTHER): Payer: BC Managed Care – PPO | Admitting: Primary Care

## 2018-07-21 ENCOUNTER — Encounter: Payer: Self-pay | Admitting: Primary Care

## 2018-07-21 DIAGNOSIS — I10 Essential (primary) hypertension: Secondary | ICD-10-CM | POA: Diagnosis not present

## 2018-07-21 MED ORDER — AMLODIPINE BESY-BENAZEPRIL HCL 10-40 MG PO CAPS: 1.0000 | ORAL_CAPSULE | Freq: Every day | ORAL | 1 refills | Status: DC

## 2018-07-21 NOTE — Assessment & Plan Note (Signed)
Above goal on current dose of amlodipine-benazepril 10-20 mg despite regular compliance. Increase to amlodipine-benazepril 10-40 mg. We will have him monitor BP regularly and will follow back up in 2 weeks via video.

## 2018-07-21 NOTE — Patient Instructions (Signed)
Stop taking amlodipine-benazepril 10-20 mg. Start taking amlodipine-benazepril 10-40 mg. Take 1 capsule once daily. Continue to monitor your blood pressure and we will meet again via video. My scheduler will contact you for an appointment.  It was nice to see you! Allie Bossier, NP-C

## 2018-07-21 NOTE — Progress Notes (Signed)
Subjective:    Patient ID: Benjamin Griffith, male    DOB: 1971-01-25, 48 y.o.   MRN: [id number]  HPI  Virtual Visit via Video Note  I connected with Benjamin Griffith on 07/21/18 at  9:00 AM EDT by a video enabled telemedicine application and verified that I am speaking with the correct person using two identifiers.   I discussed the limitations of evaluation and management by telemedicine and the availability of in person appointments. The patient expressed understanding and agreed to proceed. He is at home, I am in the office.  History of Present Illness:  Benjamin Griffith is a 48 year old male who presents today via web who presents today for follow up of hypertension.   He was last evaluated on 07/07/18 with elevated BP readings, also endorsed that he was not compliant to his prescribed medication regimen. He is managed on amlodipine-benazepril 10-20 mg.  Since his last visit he's been taking his medication consistently. He's checking his BP at home which is running 138/83, 168/99, 163/102, 135/84, 129/85, 140/86, 148/86, 150/88, 134/88, 130/88, 154/99, 127/88.  He denies chest pain, dizziness, visual changes.   Observations/Objective:  Alert and oriented Speaking in complete sentences Appears well  Assessment and Plan:  Above goal on current dose of amlodipine-benazepril 10-20 mg despite regular compliance. Increase to amlodipine-benazepril 10-40 mg. We will have him monitor BP regularly and will follow back up in 2 weeks via video.   Follow Up Instructions:  Stop taking amlodipine-benazepril 10-20 mg. Start taking amlodipine-benazepril 10-40 mg. Take 1 capsule once daily. Continue to monitor your blood pressure and we will meet again via video. My scheduler will contact you for an appointment.  It was nice to see you! Allie Bossier, NP-C    I discussed the assessment and treatment plan with the patient. The patient was provided an opportunity to ask questions and all were answered.  The patient agreed with the plan and demonstrated an understanding of the instructions.   The patient was advised to call back or seek an in-person evaluation if the symptoms worsen or if the condition fails to improve as anticipated.     Benjamin Koch, NP    Review of Systems  Respiratory: Negative for shortness of breath.   Cardiovascular: Negative for chest pain.  Neurological: Negative for dizziness and headaches.       Past Medical History:  Diagnosis Date  . Hypertension   . Polycystic kidney disease   . Prediabetes      Social History   Socioeconomic History  . Marital status: Single    Spouse name: Not on file  . Number of children: Not on file  . Years of education: Not on file  . Highest education level: Not on file  Occupational History  . Not on file  Social Needs  . Financial resource strain: Not on file  . Food insecurity:    Worry: Not on file    Inability: Not on file  . Transportation needs:    Medical: Not on file    Non-medical: Not on file  Tobacco Use  . Smoking status: Never Smoker  . Smokeless tobacco: Never Used  Substance and Sexual Activity  . Alcohol use: No    Alcohol/week: 0.0 standard drinks  . Drug use: No  . Sexual activity: Not on file  Lifestyle  . Physical activity:    Days per week: Not on file    Minutes per session: Not on file  . Stress:  Not on file  Relationships  . Social connections:    Talks on phone: Not on file    Gets together: Not on file    Attends religious service: Not on file    Active member of club or organization: Not on file    Attends meetings of clubs or organizations: Not on file    Relationship status: Not on file  . Intimate partner violence:    Fear of current or ex partner: Not on file    Emotionally abused: Not on file    Physically abused: Not on file    Forced sexual activity: Not on file  Other Topics Concern  . Not on file  Social History Narrative  . Not on file    No  past surgical history on file.  Family History  Problem Relation Age of Onset  . Hypertension Mother   . Diabetes Father   . Hypertension Father   . Kidney disease Maternal Grandmother   . Kidney disease Paternal Grandmother     No Known Allergies  Current Outpatient Medications on File Prior to Visit  Medication Sig Dispense Refill  . cetirizine (ZYRTEC) 10 MG tablet Take 10 mg by mouth daily as needed for allergies.    Marland Kitchen ibuprofen (ADVIL,MOTRIN) 200 MG tablet Take 200 mg by mouth every 6 (six) hours as needed for moderate pain.    . Multiple Vitamin (MULTIVITAMIN WITH MINERALS) TABS tablet Take 1 tablet by mouth daily.    . Omega-3 Fatty Acids (FISH OIL PO) Take 1 capsule by mouth 3 (three) times daily.     No current facility-administered medications on file prior to visit.     There were no vitals taken for this visit.   Objective:   Physical Exam  Constitutional: He is oriented to person, place, and time. He appears well-nourished.  Respiratory: Effort normal.  Neurological: He is alert and oriented to person, place, and time.  Psychiatric: He has a normal mood and affect.           Assessment & Plan:

## 2018-08-04 ENCOUNTER — Ambulatory Visit (INDEPENDENT_AMBULATORY_CARE_PROVIDER_SITE_OTHER): Payer: BC Managed Care – PPO | Admitting: Primary Care

## 2018-08-04 ENCOUNTER — Encounter: Payer: Self-pay | Admitting: Primary Care

## 2018-08-04 VITALS — BP 132/90

## 2018-08-04 DIAGNOSIS — I10 Essential (primary) hypertension: Secondary | ICD-10-CM

## 2018-08-04 NOTE — Patient Instructions (Signed)
Continue amlodipine-benazepril 10-40 mg daily for blood pressure.  Continue to monitor your blood pressure and notify me if you consistently see readings at or above 135/90.  We will be in touch for a lab appointment and BP check soon.  It was a pleasure to see you today! Allie Bossier, NP-C

## 2018-08-04 NOTE — Assessment & Plan Note (Signed)
Above goal today, but does have some improved readings. Will have him stop by for nurse BP check as well. He is committed to regular exercise so we will have him work on lifestyle changes, continue to monitor home BP readings, and report readings that are at or above 135/90 on a consistent basis.  Continue amlodipine-benazepril 10-40 mg. BMP pending.

## 2018-08-04 NOTE — Progress Notes (Signed)
Subjective:    Patient ID: Benjamin Griffith, male    DOB: September 22, 1970, 48 y.o.   MRN: [id number]  HPI  Virtual Visit via Video Note  I connected with Benjamin Griffith on 08/04/18 at  3:40 PM EDT by a video enabled telemedicine application and verified that I am speaking with the correct person using two identifiers.   I discussed the limitations of evaluation and management by telemedicine and the availability of in person appointments. The patient expressed understanding and agreed to proceed. He is at home, I am in the office.  History of Present Illness:  Benjamin Griffith is a 48 year old male who presents today via video for follow up of hypertension.  He is currently managed on amlodipine-benazepril 10-40 mg daily which was increased several weeks ago from 10-20 mg. He was asked to follow up today.  Since his last visit he's checking his BP at home which is running 127/84, 141/89, 133/93, 142/94, 143/97, 137/90, 115/76 and today was 132/90. He started exercising two days ago and plans on continuing. He denies dizziness, chest pain, shortness of breath.  BP Readings from Last 3 Encounters:  08/04/18 132/90  07/07/18 (!) 140/94  12/22/17 122/82      Observations/Objective:  Alert and oriented. Appears well. No distress.  Assessment and Plan:  Above goal today, but does have some improved readings. Will have him stop by for nurse BP check as well. He is committed to regular exercise so we will have him work on lifestyle changes, continue to monitor home BP readings, and report readings that are at or above 135/90 on a consistent basis.  Continue amlodipine-benazepril 10-40 mg. BMP pending.  Follow Up Instructions:  Continue amlodipine-benazepril 10-40 mg daily for blood pressure.  Continue to monitor your blood pressure and notify me if you consistently see readings at or above 135/90.  We will be in touch for a lab appointment and BP check soon.  It was a pleasure to see you  today! Allie Bossier, NP-C    I discussed the assessment and treatment plan with the patient. The patient was provided an opportunity to ask questions and all were answered. The patient agreed with the plan and demonstrated an understanding of the instructions.   The patient was advised to call back or seek an in-person evaluation if the symptoms worsen or if the condition fails to improve as anticipated.     Benjamin Koch, NP    Review of Systems  Eyes: Negative for visual disturbance.  Respiratory: Negative for shortness of breath.   Cardiovascular: Negative for chest pain.  Neurological: Negative for dizziness.       Past Medical History:  Diagnosis Date  . Hypertension   . Polycystic kidney disease   . Prediabetes      Social History   Socioeconomic History  . Marital status: Single    Spouse name: Not on file  . Number of children: Not on file  . Years of education: Not on file  . Highest education level: Not on file  Occupational History  . Not on file  Social Needs  . Financial resource strain: Not on file  . Food insecurity:    Worry: Not on file    Inability: Not on file  . Transportation needs:    Medical: Not on file    Non-medical: Not on file  Tobacco Use  . Smoking status: Never Smoker  . Smokeless tobacco: Never Used  Substance and Sexual Activity  .  Alcohol use: No    Alcohol/week: 0.0 standard drinks  . Drug use: No  . Sexual activity: Not on file  Lifestyle  . Physical activity:    Days per week: Not on file    Minutes per session: Not on file  . Stress: Not on file  Relationships  . Social connections:    Talks on phone: Not on file    Gets together: Not on file    Attends religious service: Not on file    Active member of club or organization: Not on file    Attends meetings of clubs or organizations: Not on file    Relationship status: Not on file  . Intimate partner violence:    Fear of current or ex partner: Not on file     Emotionally abused: Not on file    Physically abused: Not on file    Forced sexual activity: Not on file  Other Topics Concern  . Not on file  Social History Narrative  . Not on file    No past surgical history on file.  Family History  Problem Relation Age of Onset  . Hypertension Mother   . Diabetes Father   . Hypertension Father   . Kidney disease Maternal Grandmother   . Kidney disease Paternal Grandmother     No Known Allergies  Current Outpatient Medications on File Prior to Visit  Medication Sig Dispense Refill  . amLODipine-benazepril (LOTREL) 10-40 MG capsule Take 1 capsule by mouth daily. For blood pressure. 90 capsule 1  . cetirizine (ZYRTEC) 10 MG tablet Take 10 mg by mouth daily as needed for allergies.    Marland Kitchen ibuprofen (ADVIL,MOTRIN) 200 MG tablet Take 200 mg by mouth every 6 (six) hours as needed for moderate pain.    . Multiple Vitamin (MULTIVITAMIN WITH MINERALS) TABS tablet Take 1 tablet by mouth daily.    . Omega-3 Fatty Acids (FISH OIL PO) Take 1 capsule by mouth 3 (three) times daily.     No current facility-administered medications on file prior to visit.     BP 132/90    Objective:   Physical Exam  Constitutional: He appears well-nourished.  Respiratory: Effort normal.  Neurological: He is alert.  Skin: Skin is dry.  Psychiatric: He has a normal mood and affect.           Assessment & Plan:

## 2018-08-06 ENCOUNTER — Other Ambulatory Visit: Payer: BC Managed Care – PPO

## 2018-08-06 ENCOUNTER — Other Ambulatory Visit: Payer: Self-pay

## 2018-08-07 ENCOUNTER — Other Ambulatory Visit (INDEPENDENT_AMBULATORY_CARE_PROVIDER_SITE_OTHER): Payer: BC Managed Care – PPO

## 2018-08-07 ENCOUNTER — Ambulatory Visit: Payer: BC Managed Care – PPO | Admitting: *Deleted

## 2018-08-07 VITALS — BP 126/74 | HR 87

## 2018-08-07 DIAGNOSIS — I10 Essential (primary) hypertension: Secondary | ICD-10-CM

## 2018-08-07 LAB — BASIC METABOLIC PANEL
BUN: 19 mg/dL (ref 6–23)
CO2: 27 mEq/L (ref 19–32)
Calcium: 9.3 mg/dL (ref 8.4–10.5)
Chloride: 104 mEq/L (ref 96–112)
Creatinine, Ser: 1.36 mg/dL (ref 0.40–1.50)
GFR: 67.91 mL/min (ref >60.00)
Glucose, Bld: 98 mg/dL (ref 70–99)
Potassium: 4.1 mEq/L (ref 3.5–5.1)
Sodium: 139 mEq/L (ref 135–145)

## 2018-08-07 NOTE — Progress Notes (Signed)
Per Allie Bossier, NP encounter order on 08/07/2018, patient presents today for a nurse visit blood pressure check for ongoing follow up and management.  Vital Sign Readings today 126/74

## 2018-09-29 ENCOUNTER — Other Ambulatory Visit: Payer: Self-pay | Admitting: Primary Care

## 2018-12-30 ENCOUNTER — Encounter: Payer: BC Managed Care – PPO | Admitting: Primary Care

## 2019-01-20 ENCOUNTER — Other Ambulatory Visit: Payer: Self-pay | Admitting: Primary Care

## 2019-01-20 DIAGNOSIS — I10 Essential (primary) hypertension: Secondary | ICD-10-CM

## 2019-01-22 ENCOUNTER — Other Ambulatory Visit: Payer: Self-pay | Admitting: Primary Care

## 2019-01-22 DIAGNOSIS — R7303 Prediabetes: Secondary | ICD-10-CM

## 2019-01-22 DIAGNOSIS — I10 Essential (primary) hypertension: Secondary | ICD-10-CM

## 2019-01-26 ENCOUNTER — Telehealth: Payer: Self-pay

## 2019-01-26 NOTE — Telephone Encounter (Signed)
Left message to call clinic, needs COVID screen and back door lab info and front door info

## 2019-01-28 ENCOUNTER — Other Ambulatory Visit (INDEPENDENT_AMBULATORY_CARE_PROVIDER_SITE_OTHER): Payer: BC Managed Care – PPO

## 2019-01-28 DIAGNOSIS — I10 Essential (primary) hypertension: Secondary | ICD-10-CM

## 2019-01-28 DIAGNOSIS — R7303 Prediabetes: Secondary | ICD-10-CM

## 2019-01-29 LAB — LIPID PANEL
Cholesterol: 148 mg/dL (ref 0–200)
HDL: 47.3 mg/dL (ref >39.00)
LDL Cholesterol: 86 mg/dL (ref 0–99)
NonHDL: 100.91
Total CHOL/HDL Ratio: 3
Triglycerides: 75 mg/dL (ref 0.0–149.0)
VLDL: 15 mg/dL (ref 0.0–40.0)

## 2019-01-29 LAB — CBC
HCT: 46.5 % (ref 39.0–52.0)
Hemoglobin: 15.4 g/dL (ref 13.0–17.0)
MCHC: 33.1 g/dL (ref 30.0–36.0)
MCV: 93 fl (ref 78.0–100.0)
Platelets: 320 10*3/uL (ref 150.0–400.0)
RBC: 5 Mil/uL (ref 4.22–5.81)
RDW: 13.2 % (ref 11.5–15.5)
WBC: 7 10*3/uL (ref 4.0–10.5)

## 2019-01-29 LAB — COMPREHENSIVE METABOLIC PANEL
ALT: 18 U/L (ref 0–53)
AST: 19 U/L (ref 0–37)
Albumin: 4.6 g/dL (ref 3.5–5.2)
Alkaline Phosphatase: 100 U/L (ref 39–117)
BUN: 13 mg/dL (ref 6–23)
CO2: 31 mEq/L (ref 19–32)
Calcium: 9.7 mg/dL (ref 8.4–10.5)
Chloride: 102 mEq/L (ref 96–112)
Creatinine, Ser: 1.36 mg/dL (ref 0.40–1.50)
GFR: 67.77 mL/min (ref >60.00)
Glucose, Bld: 109 mg/dL — ABNORMAL HIGH (ref 70–99)
Potassium: 4.1 mEq/L (ref 3.5–5.1)
Sodium: 141 mEq/L (ref 135–145)
Total Bilirubin: 0.5 mg/dL (ref 0.2–1.2)
Total Protein: 7.5 g/dL (ref 6.0–8.3)

## 2019-01-29 LAB — HEMOGLOBIN A1C: Hgb A1c MFr Bld: 6.1 % (ref 4.6–6.5)

## 2019-02-02 ENCOUNTER — Other Ambulatory Visit: Payer: Self-pay

## 2019-02-02 ENCOUNTER — Encounter: Payer: Self-pay | Admitting: Primary Care

## 2019-02-02 ENCOUNTER — Ambulatory Visit (INDEPENDENT_AMBULATORY_CARE_PROVIDER_SITE_OTHER): Payer: BC Managed Care – PPO | Admitting: Primary Care

## 2019-02-02 VITALS — BP 126/74 | HR 88 | Temp 98.0°F | Ht 68.0 in | Wt 207.0 lb

## 2019-02-02 DIAGNOSIS — I10 Essential (primary) hypertension: Secondary | ICD-10-CM | POA: Diagnosis not present

## 2019-02-02 DIAGNOSIS — Z1211 Encounter for screening for malignant neoplasm of colon: Secondary | ICD-10-CM

## 2019-02-02 DIAGNOSIS — R7303 Prediabetes: Secondary | ICD-10-CM

## 2019-02-02 DIAGNOSIS — Z0000 Encounter for general adult medical examination without abnormal findings: Secondary | ICD-10-CM

## 2019-02-02 DIAGNOSIS — Q613 Polycystic kidney, unspecified: Secondary | ICD-10-CM | POA: Diagnosis not present

## 2019-02-02 NOTE — Assessment & Plan Note (Signed)
Stable in the office today, continue current regimen.  BMP reviewed. 

## 2019-02-02 NOTE — Assessment & Plan Note (Signed)
Tetanus UTD, declines influenza vaccination. Colonoscopy referral placed.  Discussed the importance of a healthy diet and regular exercise in order for weight loss, and to reduce the risk of any potential medical problems. Exam today unremarkable. Labs reviewed.

## 2019-02-02 NOTE — Assessment & Plan Note (Signed)
Following with nephrology but no recent evaluation due to Covid-19. He will schedule a follow up visit.

## 2019-02-02 NOTE — Patient Instructions (Signed)
Continue exercising. You should be getting 150 minutes of moderate intensity exercise weekly.  Continue to work on a healthy diet. Ensure you are consuming 64 ounces of water daily.  Follow up with the kidney doctor as discussed.  You will be contacted regarding your referral to GI for the colonoscopy.  Please let us know if you have not been contacted within one week.   It was a pleasure to see you today!   Preventive Care 66-47 Years Old, Male Preventive care refers to lifestyle choices and visits with your health care provider that can promote health and wellness. This includes:  A yearly physical exam. This is also called an annual well check.  Regular dental and eye exams.  Immunizations.  Screening for certain conditions.  Healthy lifestyle choices, such as eating a healthy diet, getting regular exercise, not using drugs or products that contain nicotine and tobacco, and limiting alcohol use. What can I expect for my preventive care visit? Physical exam Your health care provider will check:  Height and weight. These may be used to calculate body mass index (BMI), which is a measurement that tells if you are at a healthy weight.  Heart rate and blood pressure.  Your skin for abnormal spots. Counseling Your health care provider may ask you questions about:  Alcohol, tobacco, and drug use.  Emotional well-being.  Home and relationship well-being.  Sexual activity.  Eating habits.  Work and work Statistician. What immunizations do I need?  Influenza (flu) vaccine  This is recommended every year. Tetanus, diphtheria, and pertussis (Tdap) vaccine  You may need a Td booster every 10 years. Varicella (chickenpox) vaccine  You may need this vaccine if you have not already been vaccinated. Zoster (shingles) vaccine  You may need this after age 47. Measles, mumps, and rubella (MMR) vaccine  You may need at least one dose of MMR if you were born in 1957 or  later. You may also need a second dose. Pneumococcal conjugate (PCV13) vaccine  You may need this if you have certain conditions and were not previously vaccinated. Pneumococcal polysaccharide (PPSV23) vaccine  You may need one or two doses if you smoke cigarettes or if you have certain conditions. Meningococcal conjugate (MenACWY) vaccine  You may need this if you have certain conditions. Hepatitis A vaccine  You may need this if you have certain conditions or if you travel or work in places where you may be exposed to hepatitis A. Hepatitis B vaccine  You may need this if you have certain conditions or if you travel or work in places where you may be exposed to hepatitis B. Haemophilus influenzae type b (Hib) vaccine  You may need this if you have certain risk factors. Human papillomavirus (HPV) vaccine  If recommended by your health care provider, you may need three doses over 6 months. You may receive vaccines as individual doses or as more than one vaccine together in one shot (combination vaccines). Talk with your health care provider about the risks and benefits of combination vaccines. What tests do I need? Blood tests  Lipid and cholesterol levels. These may be checked every 5 years, or more frequently if you are over 18 years old.  Hepatitis C test.  Hepatitis B test. Screening  Lung cancer screening. You may have this screening every year starting at age 47 if you have a 30-pack-year history of smoking and currently smoke or have quit within the past 15 years.  Prostate cancer screening. Recommendations will  vary depending on your family history and other risks.  Colorectal cancer screening. All adults should have this screening starting at age 47 and continuing until age 47. Your health care provider may recommend screening at age 28 if you are at increased risk. You will have tests every 1-10 years, depending on your results and the type of screening  test.  Diabetes screening. This is done by checking your blood sugar (glucose) after you have not eaten for a while (fasting). You may have this done every 1-3 years.  Sexually transmitted disease (STD) testing. Follow these instructions at home: Eating and drinking  Eat a diet that includes fresh fruits and vegetables, whole grains, lean protein, and low-fat dairy products.  Take vitamin and mineral supplements as recommended by your health care provider.  Do not drink alcohol if your health care provider tells you not to drink.  If you drink alcohol: ? Limit how much you have to 0-2 drinks a day. ? Be aware of how much alcohol is in your drink. In the [place] one drink equals one 12 oz bottle of beer (355 mL), one 5 oz glass of wine (148 mL), or one 1 oz glass of hard liquor (44 mL). Lifestyle  Take daily care of your teeth and gums.  Stay active. Exercise for at least 30 minutes on 5 or more days each week.  Do not use any products that contain nicotine or tobacco, such as cigarettes, e-cigarettes, and chewing tobacco. If you need help quitting, ask your health care provider.  If you are sexually active, practice safe sex. Use a condom or other form of protection to prevent STIs (sexually transmitted infections).  Talk with your health care provider about taking a low-dose aspirin every day starting at age 47. What's next?  Go to your health care provider once a year for a well check visit.  Ask your health care provider how often you should have your eyes and teeth checked.  Stay up to date on all vaccines. This information is not intended to replace advice given to you by your health care provider. Make sure you discuss any questions you have with your health care provider. Document Released: 04/28/2015 Document Revised: 03/26/2018 Document Reviewed: 03/26/2018 Elsevier Patient Education  2020 [employer].

## 2019-02-02 NOTE — Progress Notes (Signed)
Subjective:    Patient ID: Benjamin Griffith, male    DOB: July 20, 1970, 48 y.o.   MRN: [id number]  HPI  Benjamin Griffith is a 48 year old male who presents today for complete physical. He would like a colonoscopy.   Immunizations: -Tetanus: Completed in 2016 -Influenza: Due today, declines   Diet: He endorses a fair diet.  Mostly eating chicken, salads, greens, mixed veggies. Desserts once weekly on average. Drinking juice, water, occasional soda. Exercise: He is walking/running three days weekly.  Eye exam: Completed several years ago Dental exam: No recent exam  BP Readings from Last 3 Encounters:  02/02/19 126/74  08/07/18 126/74  08/04/18 132/90     Review of Systems  Constitutional: Negative for unexpected weight change.  HENT: Negative for rhinorrhea.   Respiratory: Negative for cough and shortness of breath.   Cardiovascular: Negative for chest pain.  Gastrointestinal: Negative for constipation and diarrhea.  Genitourinary: Negative for difficulty urinating.  Musculoskeletal: Negative for arthralgias and myalgias.  Skin: Negative for rash.  Allergic/Immunologic: Negative for environmental allergies.  Neurological: Negative for dizziness, numbness and headaches.  Psychiatric/Behavioral: The patient is not nervous/anxious.        Past Medical History:  Diagnosis Date  . Hypertension   . Polycystic kidney disease   . Prediabetes      Social History   Socioeconomic History  . Marital status: Single    Spouse name: Not on file  . Number of children: Not on file  . Years of education: Not on file  . Highest education level: Not on file  Occupational History  . Not on file  Social Needs  . Financial resource strain: Not on file  . Food insecurity    Worry: Not on file    Inability: Not on file  . Transportation needs    Medical: Not on file    Non-medical: Not on file  Tobacco Use  . Smoking status: Never Smoker  . Smokeless tobacco: Never Used  Substance and  Sexual Activity  . Alcohol use: No    Alcohol/week: 0.0 standard drinks  . Drug use: No  . Sexual activity: Not on file  Lifestyle  . Physical activity    Days per week: Not on file    Minutes per session: Not on file  . Stress: Not on file  Relationships  . Social Herbalist on phone: Not on file    Gets together: Not on file    Attends religious service: Not on file    Active member of club or organization: Not on file    Attends meetings of clubs or organizations: Not on file    Relationship status: Not on file  . Intimate partner violence    Fear of current or ex partner: Not on file    Emotionally abused: Not on file    Physically abused: Not on file    Forced sexual activity: Not on file  Other Topics Concern  . Not on file  Social History Narrative  . Not on file    No past surgical history on file.  Family History  Problem Relation Age of Onset  . Hypertension Mother   . Diabetes Father   . Hypertension Father   . Kidney disease Maternal Grandmother   . Kidney disease Paternal Grandmother     No Known Allergies  Current Outpatient Medications on File Prior to Visit  Medication Sig Dispense Refill  . amLODipine-benazepril (LOTREL) 10-40 MG capsule TAKE  1 CAPSULE BY MOUTH DAILY. FOR BLOOD PRESSURE. 90 capsule 1  . cetirizine (ZYRTEC) 10 MG tablet Take 10 mg by mouth daily as needed for allergies.    Marland Kitchen ibuprofen (ADVIL,MOTRIN) 200 MG tablet Take 200 mg by mouth every 6 (six) hours as needed for moderate pain.    . Multiple Vitamin (MULTIVITAMIN WITH MINERALS) TABS tablet Take 1 tablet by mouth daily.    . Omega-3 Fatty Acids (FISH OIL PO) Take 1 capsule by mouth 3 (three) times daily.     No current facility-administered medications on file prior to visit.     BP 126/74   Pulse 88   Temp 98 F (36.7 C) (Temporal)   Ht 5\' 8"  (1.727 m)   Wt 207 lb (93.9 kg)   SpO2 98%   BMI 31.47 kg/m    Objective:   Physical Exam  Constitutional: He is  oriented to person, place, and time. He appears well-nourished.  HENT:  Right Ear: Tympanic membrane and ear canal normal.  Left Ear: Tympanic membrane and ear canal normal.  Mouth/Throat: Oropharynx is clear and moist.  Eyes: Pupils are equal, round, and reactive to light. EOM are normal.  Neck: Neck supple.  Cardiovascular: Normal rate and regular rhythm.  Respiratory: Effort normal and breath sounds normal.  GI: Soft. Bowel sounds are normal. There is no abdominal tenderness.  Musculoskeletal: Normal range of motion.  Neurological: He is alert and oriented to person, place, and time.  Skin: Skin is warm and dry.  Psychiatric: He has a normal mood and affect.           Assessment & Plan:

## 2019-02-02 NOTE — Assessment & Plan Note (Signed)
Steady increase in A1C over the years, discussed this with patient.  Encouraged to increase exercise, work on diet.  Continue to monitor.

## 2019-02-17 ENCOUNTER — Encounter: Payer: Self-pay | Admitting: Gastroenterology

## 2019-03-22 NOTE — Telephone Encounter (Signed)
Patient left a voicemail stating that he had sent a mychart message and would like Allie Bossier to let him know what she thinks?

## 2019-03-24 ENCOUNTER — Ambulatory Visit: Payer: BC Managed Care – PPO | Admitting: Gastroenterology

## 2019-04-30 ENCOUNTER — Ambulatory Visit: Payer: BC Managed Care – PPO | Admitting: Gastroenterology

## 2019-08-01 ENCOUNTER — Other Ambulatory Visit: Payer: Self-pay | Admitting: Primary Care

## 2019-08-01 DIAGNOSIS — I10 Essential (primary) hypertension: Secondary | ICD-10-CM

## 2019-08-03 ENCOUNTER — Ambulatory Visit: Payer: BC Managed Care – PPO | Admitting: Primary Care

## 2019-08-09 ENCOUNTER — Encounter: Payer: Self-pay | Admitting: Primary Care

## 2019-08-09 ENCOUNTER — Other Ambulatory Visit: Payer: Self-pay

## 2019-08-09 ENCOUNTER — Ambulatory Visit: Payer: BC Managed Care – PPO | Admitting: Primary Care

## 2019-08-09 VITALS — BP 144/90 | HR 84 | Temp 96.4°F | Ht 68.0 in | Wt 209.8 lb

## 2019-08-09 DIAGNOSIS — R7303 Prediabetes: Secondary | ICD-10-CM

## 2019-08-09 DIAGNOSIS — I10 Essential (primary) hypertension: Secondary | ICD-10-CM | POA: Diagnosis not present

## 2019-08-09 DIAGNOSIS — Q613 Polycystic kidney, unspecified: Secondary | ICD-10-CM

## 2019-08-09 NOTE — Assessment & Plan Note (Signed)
Above goal in the office today despite compliance to amlodipine-benazepril. Suspect elevated BP readings are secondary to poor diet and lack of exercise.  He is motivated to improve his diet and increase exercise. We will not make any changes today, but plan to see him back in 2 months for follow up.

## 2019-08-09 NOTE — Assessment & Plan Note (Signed)
Poor diet, no regular exercise.  Repeat A1C pending.

## 2019-08-09 NOTE — Patient Instructions (Signed)
Stop by the lab prior to leaving today. I will notify you of your results once received.   Start exercising. You should be getting 150 minutes of moderate intensity exercise weekly.  It's important to improve your diet by reducing consumption of fast food, fried food, processed snack foods, sugary drinks. Increase consumption of fresh vegetables and fruits, whole grains, water.  Ensure you are drinking 64 ounces of water daily.  Monitor your blood pressure at home.  Please schedule a follow up appointment in 2 months for blood pressure check.  It was a pleasure to see you today!

## 2019-08-09 NOTE — Progress Notes (Signed)
Subjective:    Patient ID: Benjamin Griffith, male    DOB: 26-Mar-1971, 49 y.o.   MRN: [id number]  HPI  This visit occurred during the SARS-CoV-2 public health emergency.  Safety protocols were in place, including screening questions prior to the visit, additional usage of staff PPE, and extensive cleaning of exam room while observing appropriate contact time as indicated for disinfecting solutions.   [name] is a 49 year old male with a history of hypertension, polycystic kidney disease, prediabetes who presents today for follow up.  1) Essential Hypertension: Currently managed on amlodipine-benazepril 10-40 mg. He is complaint to his amlodipine-benazepril daily.  He has not been exercising, endorses a poor diet with salty and fast food over the last year. He checked his BP at home yesterday which was 141/90, otherwise has not been checking. He has noticed some headaches, not sure if this is allergy related. Also with mild dizziness intermittently.   Wt Readings from Last 3 Encounters:  08/09/19 209 lb 12 oz (95.1 kg)  02/02/19 207 lb (93.9 kg)  07/07/18 210 lb 4 oz (95.4 kg)     BP Readings from Last 3 Encounters:  08/09/19 (!) 144/90  02/02/19 126/74  08/07/18 126/74   2) Polycystic Kidney Disease: Previously following with nephrology but no recent visit due to Covid-19 pandemic. Last renal check was in October 2020, stable.   3) Prediabetes: A1C of 6.1 in October 2020. Since his last visit he endorses a poor diet and little exercise.   Review of Systems  Eyes: Negative for visual disturbance.  Respiratory: Negative for shortness of breath.   Cardiovascular: Negative for chest pain.  Neurological: Positive for dizziness and headaches.       Past Medical History:  Diagnosis Date  . COVID-19 virus infection   . Hypertension   . Polycystic kidney disease   . Prediabetes      Social History   Socioeconomic History  . Marital status: Single    Spouse name: Not on file  .  Number of children: Not on file  . Years of education: Not on file  . Highest education level: Not on file  Occupational History  . Not on file  Tobacco Use  . Smoking status: Never Smoker  . Smokeless tobacco: Never Used  Substance and Sexual Activity  . Alcohol use: No    Alcohol/week: 0.0 standard drinks  . Drug use: No  . Sexual activity: Not on file  Other Topics Concern  . Not on file  Social History Narrative  . Not on file   Social Determinants of Health   Financial Resource Strain:   . Difficulty of Paying Living Expenses:   Food Insecurity:   . Worried About Charity fundraiser in the Last Year:   . Arboriculturist in the Last Year:   Transportation Needs:   . Film/video editor (Medical):   Marland Kitchen Lack of Transportation (Non-Medical):   Physical Activity:   . Days of Exercise per Week:   . Minutes of Exercise per Session:   Stress:   . Feeling of Stress :   Social Connections:   . Frequency of Communication with Friends and Family:   . Frequency of Social Gatherings with Friends and Family:   . Attends Religious Services:   . Active Member of Clubs or Organizations:   . Attends Archivist Meetings:   Marland Kitchen Marital Status:   Intimate Partner Violence:   . Fear of Current or  Ex-Partner:   . Emotionally Abused:   Marland Kitchen Physically Abused:   . Sexually Abused:     No past surgical history on file.  Family History  Problem Relation Age of Onset  . Hypertension Mother   . Diabetes Father   . Hypertension Father   . Kidney disease Maternal Grandmother   . Kidney disease Paternal Grandmother     No Known Allergies  Current Outpatient Medications on File Prior to Visit  Medication Sig Dispense Refill  . amLODipine-benazepril (LOTREL) 10-40 MG capsule TAKE 1 CAPSULE BY MOUTH DAILY. FOR BLOOD PRESSURE. 90 capsule 1  . cetirizine (ZYRTEC) 10 MG tablet Take 10 mg by mouth daily as needed for allergies.    Marland Kitchen ibuprofen (ADVIL,MOTRIN) 200 MG tablet Take 200  mg by mouth every 6 (six) hours as needed for moderate pain.    . Multiple Vitamin (MULTIVITAMIN WITH MINERALS) TABS tablet Take 1 tablet by mouth daily.    . Omega-3 Fatty Acids (FISH OIL PO) Take 1 capsule by mouth 3 (three) times daily.     No current facility-administered medications on file prior to visit.    BP (!) 144/90   Pulse 84   Temp (!) 96.4 F (35.8 C) (Temporal)   Ht 5\' 8"  (1.727 m)   Wt 209 lb 12 oz (95.1 kg)   SpO2 98%   BMI 31.89 kg/m    Objective:   Physical Exam  Constitutional: He appears well-nourished.  Cardiovascular: Normal rate and regular rhythm.  Respiratory: Effort normal and breath sounds normal.  Musculoskeletal:     Cervical back: Neck supple.  Skin: Skin is warm and dry.  Psychiatric: He has a normal mood and affect.           Assessment & Plan:

## 2019-08-09 NOTE — Assessment & Plan Note (Signed)
No recent nephrology evaluation. Repeat renal function pending.  Need to gain better control over BP.

## 2019-08-10 LAB — BASIC METABOLIC PANEL
BUN: 21 mg/dL (ref 6–23)
CO2: 26 mEq/L (ref 19–32)
Calcium: 9.5 mg/dL (ref 8.4–10.5)
Chloride: 104 mEq/L (ref 96–112)
Creatinine, Ser: 1.38 mg/dL (ref 0.40–1.50)
GFR: 66.49 mL/min (ref >60.00)
Glucose, Bld: 102 mg/dL — ABNORMAL HIGH (ref 70–99)
Potassium: 3.8 mEq/L (ref 3.5–5.1)
Sodium: 138 mEq/L (ref 135–145)

## 2019-08-10 LAB — HEMOGLOBIN A1C: Hgb A1c MFr Bld: 5.8 % (ref 4.6–6.5)

## 2019-09-29 DIAGNOSIS — I10 Essential (primary) hypertension: Secondary | ICD-10-CM

## 2019-09-29 MED ORDER — AMLODIPINE BESY-BENAZEPRIL HCL 10-40 MG PO CAPS: 1.0000 | ORAL_CAPSULE | Freq: Every day | ORAL | 1 refills | Status: DC

## 2019-11-02 ENCOUNTER — Ambulatory Visit: Payer: BC Managed Care – PPO | Admitting: Primary Care

## 2019-11-02 ENCOUNTER — Other Ambulatory Visit: Payer: Self-pay

## 2019-11-02 ENCOUNTER — Encounter: Payer: Self-pay | Admitting: Primary Care

## 2019-11-02 DIAGNOSIS — M545 Low back pain, unspecified: Secondary | ICD-10-CM

## 2019-11-02 DIAGNOSIS — I10 Essential (primary) hypertension: Secondary | ICD-10-CM

## 2019-11-02 DIAGNOSIS — M549 Dorsalgia, unspecified: Secondary | ICD-10-CM | POA: Insufficient documentation

## 2019-11-02 NOTE — Patient Instructions (Signed)
Continue to work on weight loss through diet and exercise.  Have your blood pressure checked at the kidney doctor.   It was a pleasure to see you today!

## 2019-11-02 NOTE — Progress Notes (Signed)
Subjective:    Patient ID: Benjamin Griffith, male    DOB: July 18, 1970, 49 y.o.   MRN: [id number]  HPI  This visit occurred during the SARS-CoV-2 public health emergency.  Safety protocols were in place, including screening questions prior to the visit, additional usage of staff PPE, and extensive cleaning of exam room while observing appropriate contact time as indicated for disinfecting solutions.   Benjamin Griffith is a 49 year old male with a history of hypertension, polycystic kidney disease, prediabetes who presents today with a chief complaint of back pain. He is also due for repeat blood pressure check.   His pain is located to the left lower back with radiation to left lower groin which began one week ago after playing a few days of basketball. His pain resolved three days ago.   When his pain was present he denies weakness to left lower extremity, numbness to left lower extremity, loss of bowel/bladder control, hematuria. He has an appointment with his nephrologist next month.   BP Readings from Last 3 Encounters:  11/02/19 136/86  08/09/19 (!) 144/90  02/02/19 126/74     Review of Systems  Genitourinary: Negative for hematuria.  Musculoskeletal: Negative for arthralgias and back pain.  Neurological: Negative for weakness and numbness.       Past Medical History:  Diagnosis Date  . COVID-19 virus infection   . Hypertension   . Polycystic kidney disease   . Prediabetes      Social History   Socioeconomic History  . Marital status: Single    Spouse name: Not on file  . Number of children: Not on file  . Years of education: Not on file  . Highest education level: Not on file  Occupational History  . Not on file  Tobacco Use  . Smoking status: Never Smoker  . Smokeless tobacco: Never Used  Substance and Sexual Activity  . Alcohol use: No    Alcohol/week: 0.0 standard drinks  . Drug use: No  . Sexual activity: Not on file  Other Topics Concern  . Not on file  Social  History Narrative  . Not on file   Social Determinants of Health   Financial Resource Strain:   . Difficulty of Paying Living Expenses:   Food Insecurity:   . Worried About Charity fundraiser in the Last Year:   . Arboriculturist in the Last Year:   Transportation Needs:   . Film/video editor (Medical):   Marland Kitchen Lack of Transportation (Non-Medical):   Physical Activity:   . Days of Exercise per Week:   . Minutes of Exercise per Session:   Stress:   . Feeling of Stress :   Social Connections:   . Frequency of Communication with Friends and Family:   . Frequency of Social Gatherings with Friends and Family:   . Attends Religious Services:   . Active Member of Clubs or Organizations:   . Attends Archivist Meetings:   Marland Kitchen Marital Status:   Intimate Partner Violence:   . Fear of Current or Ex-Partner:   . Emotionally Abused:   Marland Kitchen Physically Abused:   . Sexually Abused:     No past surgical history on file.  Family History  Problem Relation Age of Onset  . Hypertension Mother   . Diabetes Father   . Hypertension Father   . Kidney disease Maternal Grandmother   . Kidney disease Paternal Grandmother     No Known Allergies  Current  Outpatient Medications on File Prior to Visit  Medication Sig Dispense Refill  . amLODipine-benazepril (LOTREL) 10-40 MG capsule Take 1 capsule by mouth daily. For blood pressure. 90 capsule 1  . cetirizine (ZYRTEC) 10 MG tablet Take 10 mg by mouth daily as needed for allergies.    Marland Kitchen ibuprofen (ADVIL,MOTRIN) 200 MG tablet Take 200 mg by mouth every 6 (six) hours as needed for moderate pain.    . Multiple Vitamin (MULTIVITAMIN WITH MINERALS) TABS tablet Take 1 tablet by mouth daily.    . Omega-3 Fatty Acids (FISH OIL PO) Take 1 capsule by mouth 3 (three) times daily.     No current facility-administered medications on file prior to visit.    BP 136/86   Pulse 82   Temp (!) 95.9 F (35.5 C) (Temporal)   Ht 5\' 8"  (1.727 m)   Wt 211  lb 8 oz (95.9 kg)   SpO2 98%   BMI 32.16 kg/m    Objective:   Physical Exam Cardiovascular:     Rate and Rhythm: Normal rate and regular rhythm.  Pulmonary:     Effort: Pulmonary effort is normal.     Breath sounds: Normal breath sounds.  Musculoskeletal:        General: Normal range of motion.     Lumbar back: Normal. No tenderness or bony tenderness. Normal range of motion. Negative left straight leg raise test.     Comments: 5/5 strength to bilateral lower extremities   Skin:    General: Skin is warm and dry.  Neurological:     Mental Status: He is alert.            Assessment & Plan:

## 2019-11-02 NOTE — Assessment & Plan Note (Signed)
Improved since last visit.  Encouraged to continue to work on dietary changes, regular exercise.  Continue amlodipine-benazepril. BMP from April 2021 reviewed. Following with nephrology.

## 2019-11-02 NOTE — Assessment & Plan Note (Signed)
Acute to left side after playing basketball for a few days. Pain resolved three days ago.  Exam today benign. Discussed stretching prior to activity and exercise. Return precautions provided.

## 2019-12-29 ENCOUNTER — Other Ambulatory Visit (HOSPITAL_COMMUNITY): Payer: Self-pay | Admitting: Urology

## 2019-12-29 DIAGNOSIS — D49512 Neoplasm of unspecified behavior of left kidney: Secondary | ICD-10-CM

## 2020-01-06 ENCOUNTER — Other Ambulatory Visit: Payer: Self-pay

## 2020-01-06 ENCOUNTER — Ambulatory Visit (HOSPITAL_COMMUNITY)
Admission: RE | Admit: 2020-01-06 | Discharge: 2020-01-06 | Disposition: A | Discharge status: Home / Self Care | Payer: BC Managed Care – PPO | Source: Ambulatory Visit | Attending: Urology | Admitting: Urology

## 2020-01-06 DIAGNOSIS — D49512 Neoplasm of unspecified behavior of left kidney: Secondary | ICD-10-CM | POA: Insufficient documentation

## 2020-01-06 MED ORDER — GADOBUTROL 1 MMOL/ML IV SOLN
9.0000 mL | Freq: Once | INTRAVENOUS | Status: AC | PRN
  Administered 2020-01-06: 9 mL via INTRAVENOUS

## 2020-01-26 ENCOUNTER — Other Ambulatory Visit: Payer: Self-pay

## 2020-01-26 DIAGNOSIS — I10 Essential (primary) hypertension: Secondary | ICD-10-CM

## 2020-01-26 MED ORDER — AMLODIPINE BESY-BENAZEPRIL HCL 10-40 MG PO CAPS: 1.0000 | ORAL_CAPSULE | Freq: Every day | ORAL | 1 refills | Status: DC

## 2020-07-27 ENCOUNTER — Encounter: Payer: BC Managed Care – PPO | Admitting: Primary Care

## 2020-08-02 ENCOUNTER — Ambulatory Visit (INDEPENDENT_AMBULATORY_CARE_PROVIDER_SITE_OTHER): Payer: BC Managed Care – PPO | Admitting: Primary Care

## 2020-08-02 ENCOUNTER — Other Ambulatory Visit: Payer: Self-pay

## 2020-08-02 ENCOUNTER — Encounter: Payer: Self-pay | Admitting: Primary Care

## 2020-08-02 VITALS — BP 132/74 | HR 80 | Temp 97.7°F | Ht 68.0 in | Wt 210.0 lb

## 2020-08-02 DIAGNOSIS — I10 Essential (primary) hypertension: Secondary | ICD-10-CM | POA: Diagnosis not present

## 2020-08-02 DIAGNOSIS — R7303 Prediabetes: Secondary | ICD-10-CM

## 2020-08-02 DIAGNOSIS — Z1211 Encounter for screening for malignant neoplasm of colon: Secondary | ICD-10-CM

## 2020-08-02 DIAGNOSIS — Q613 Polycystic kidney, unspecified: Secondary | ICD-10-CM

## 2020-08-02 DIAGNOSIS — R5383 Other fatigue: Secondary | ICD-10-CM | POA: Insufficient documentation

## 2020-08-02 DIAGNOSIS — Z0000 Encounter for general adult medical examination without abnormal findings: Secondary | ICD-10-CM | POA: Diagnosis not present

## 2020-08-02 NOTE — Assessment & Plan Note (Signed)
Commended him on diet and regular exercise. Repeat A1C pending.

## 2020-08-02 NOTE — Assessment & Plan Note (Signed)
Follows with nephrology, continue annually monitoring.  CMP pending.

## 2020-08-02 NOTE — Assessment & Plan Note (Addendum)
Chronic. Didn't discuss much today, offered further work up for which he declines today.  Checking TSH and CBC along with other routine labs.

## 2020-08-02 NOTE — Assessment & Plan Note (Signed)
Improved today, continue amlodipine-benazepril 10-40 mg. CMP pending.

## 2020-08-02 NOTE — Progress Notes (Signed)
Subjective:    Patient ID: Benjamin Griffith, male    DOB: August 03, 1970, 50 y.o.   MRN: [id number]  HPI  Benjamin Griffith is a very pleasant 50 y.o. male who presents today for complete physical.  Immunizations: -Tetanus: 2016 -Influenza: Did not complete this season  -Covid-19: Completed three vaccines  Diet: He endorses a heathy diet. Exercise: Exercises regularly   Eye exam: Completed in 2021 Dental exam: No recent exam  Colonoscopy: Never completeda  BP Readings from Last 3 Encounters:  08/02/20 132/74  11/02/19 136/86  08/09/19 (!) 144/90       Review of Systems  Constitutional: Positive for fatigue. Negative for unexpected weight change.  HENT: Negative for rhinorrhea.   Eyes: Negative for visual disturbance.  Respiratory: Negative for cough and shortness of breath.   Cardiovascular: Negative for chest pain.  Gastrointestinal: Negative for constipation and diarrhea.  Genitourinary: Negative for difficulty urinating.  Musculoskeletal: Negative for arthralgias and myalgias.  Skin: Negative for rash.  Allergic/Immunologic: Negative for environmental allergies.  Neurological: Negative for dizziness and headaches.  Psychiatric/Behavioral:       Has noticed little motivation to do things, denies feeling depressed         Past Medical History:  Diagnosis Date  . COVID-19 virus infection   . Hypertension   . Polycystic kidney disease   . Prediabetes     Social History   Socioeconomic History  . Marital status: Married    Spouse name: Not on file  . Number of children: Not on file  . Years of education: Not on file  . Highest education level: Not on file  Occupational History  . Not on file  Tobacco Use  . Smoking status: Never Smoker  . Smokeless tobacco: Never Used  Substance and Sexual Activity  . Alcohol use: No    Alcohol/week: 0.0 standard drinks  . Drug use: No  . Sexual activity: Not on file  Other Topics Concern  . Not on file  Social History  Narrative  . Not on file   Social Determinants of Health   Financial Resource Strain: Not on file  Food Insecurity: Not on file  Transportation Needs: Not on file  Physical Activity: Not on file  Stress: Not on file  Social Connections: Not on file  Intimate Partner Violence: Not on file    History reviewed. No pertinent surgical history.  Family History  Problem Relation Age of Onset  . Hypertension Mother   . Diabetes Father   . Hypertension Father   . Kidney disease Maternal Grandmother   . Kidney disease Paternal Grandmother     No Known Allergies  Current Outpatient Medications on File Prior to Visit  Medication Sig Dispense Refill  . amLODipine-benazepril (LOTREL) 10-40 MG capsule Take 1 capsule by mouth daily. For blood pressure. 90 capsule 1  . cetirizine (ZYRTEC) 10 MG tablet Take 10 mg by mouth daily as needed for allergies.    Marland Kitchen ibuprofen (ADVIL,MOTRIN) 200 MG tablet Take 200 mg by mouth every 6 (six) hours as needed for moderate pain.    . Multiple Vitamin (MULTIVITAMIN WITH MINERALS) TABS tablet Take 1 tablet by mouth daily.    . Omega-3 Fatty Acids (FISH OIL PO) Take 1 capsule by mouth 3 (three) times daily.     No current facility-administered medications on file prior to visit.    BP 132/74   Pulse 80   Temp 97.7 F (36.5 C) (Temporal)   Ht 5\' 8"  (1.727 m)  Wt 210 lb (95.3 kg)   SpO2 98%   BMI 31.93 kg/m  Objective:   Physical Exam HENT:     Right Ear: Tympanic membrane and ear canal normal.     Left Ear: Tympanic membrane and ear canal normal.     Nose: Nose normal.     Right Sinus: No maxillary sinus tenderness or frontal sinus tenderness.     Left Sinus: No maxillary sinus tenderness or frontal sinus tenderness.  Eyes:     Conjunctiva/sclera: Conjunctivae normal.     Pupils: Pupils are equal, round, and reactive to light.  Neck:     Thyroid: No thyromegaly.     Vascular: No carotid bruit.  Cardiovascular:     Rate and Rhythm: Normal  rate and regular rhythm.     Heart sounds: Normal heart sounds.  Pulmonary:     Effort: Pulmonary effort is normal.     Breath sounds: Normal breath sounds. No wheezing or rales.  Abdominal:     General: Bowel sounds are normal.     Palpations: Abdomen is soft.     Tenderness: There is no abdominal tenderness.  Musculoskeletal:        General: Normal range of motion.     Cervical back: Neck supple.  Skin:    General: Skin is warm and dry.  Neurological:     Mental Status: He is alert and oriented to person, place, and time.     Cranial Nerves: No cranial nerve deficit.     Deep Tendon Reflexes: Reflexes are normal and symmetric.  Psychiatric:        Mood and Affect: Mood normal.           Assessment & Plan:      This visit occurred during the SARS-CoV-2 public health emergency.  Safety protocols were in place, including screening questions prior to the visit, additional usage of staff PPE, and extensive cleaning of exam room while observing appropriate contact time as indicated for disinfecting solutions.

## 2020-08-02 NOTE — Assessment & Plan Note (Signed)
Immunizations UTD.  Colonoscopy due and pending. Discussed the importance of a healthy diet and regular exercise in order for weight loss, and to reduce the risk of any potential medical problems.  Exam today stable. Labs pending.

## 2020-08-02 NOTE — Patient Instructions (Signed)
Stop by the lab prior to leaving today. I will notify you of your results once received.   You will be contacted regarding your referral to GI for the colonoscopy.  Please let us know if you have not been contacted within two weeks.   It was a pleasure to see you today!   Preventive Care 25-49 Years Old, Male Preventive care refers to lifestyle choices and visits with your health care provider that can promote health and wellness. This includes:  A yearly physical exam. This is also called an annual wellness visit.  Regular dental and eye exams.  Immunizations.  Screening for certain conditions.  Healthy lifestyle choices, such as: ? Eating a healthy diet. ? Getting regular exercise. ? Not using drugs or products that contain nicotine and tobacco. ? Limiting alcohol use. What can I expect for my preventive care visit? Physical exam Your health care provider will check your:  Height and weight. These may be used to calculate your BMI (body mass index). BMI is a measurement that tells if you are at a healthy weight.  Heart rate and blood pressure.  Body temperature.  Skin for abnormal spots. Counseling Your health care provider may ask you questions about your:  Past medical problems.  Family's medical history.  Alcohol, tobacco, and drug use.  Emotional well-being.  Home life and relationship well-being.  Sexual activity.  Diet, exercise, and sleep habits.  Work and work Statistician.  Access to firearms. What immunizations do I need? Vaccines are usually given at various ages, according to a schedule. Your health care provider will recommend vaccines for you based on your age, medical history, and lifestyle or other factors, such as travel or where you work.   What tests do I need? Blood tests  Lipid and cholesterol levels. These may be checked every 5 years, or more often if you are over 57 years old.  Hepatitis C test.  Hepatitis B  test. Screening  Lung cancer screening. You may have this screening every year starting at age 80 if you have a 30-pack-year history of smoking and currently smoke or have quit within the past 15 years.  Prostate cancer screening. Recommendations will vary depending on your family history and other risks.  Genital exam to check for testicular cancer or hernias.  Colorectal cancer screening. ? All adults should have this screening starting at age 5 and continuing until age 82. ? Your health care provider may recommend screening at age 70 if you are at increased risk. ? You will have tests every 1-10 years, depending on your results and the type of screening test.  Diabetes screening. ? This is done by checking your blood sugar (glucose) after you have not eaten for a while (fasting). ? You may have this done every 1-3 years.  STD (sexually transmitted disease) testing, if you are at risk. Follow these instructions at home: Eating and drinking  Eat a diet that includes fresh fruits and vegetables, whole grains, lean protein, and low-fat dairy products.  Take vitamin and mineral supplements as recommended by your health care provider.  Do not drink alcohol if your health care provider tells you not to drink.  If you drink alcohol: ? Limit how much you have to 0-2 drinks a day. ? Be aware of how much alcohol is in your drink. In the [place] one drink equals one 12 oz bottle of beer (355 mL), one 5 oz glass of wine (148 mL), or one 1 oz glass  of hard liquor (44 mL).   Lifestyle  Take daily care of your teeth and gums. Brush your teeth every morning and night with fluoride toothpaste. Floss one time each day.  Stay active. Exercise for at least 30 minutes 5 or more days each week.  Do not use any products that contain nicotine or tobacco, such as cigarettes, e-cigarettes, and chewing tobacco. If you need help quitting, ask your health care provider.  Do not use drugs.  If you are  sexually active, practice safe sex. Use a condom or other form of protection to prevent STIs (sexually transmitted infections).  If told by your health care provider, take low-dose aspirin daily starting at age 91.  Find healthy ways to cope with stress, such as: ? Meditation, yoga, or listening to music. ? Journaling. ? Talking to a trusted person. ? Spending time with friends and family. Safety  Always wear your seat belt while driving or riding in a vehicle.  Do not drive: ? If you have been drinking alcohol. Do not ride with someone who has been drinking. ? When you are tired or distracted. ? While texting.  Wear a helmet and other protective equipment during sports activities.  If you have firearms in your house, make sure you follow all gun safety procedures. What's next?  Go to your health care provider once a year for an annual wellness visit.  Ask your health care provider how often you should have your eyes and teeth checked.  Stay up to date on all vaccines. This information is not intended to replace advice given to you by your health care provider. Make sure you discuss any questions you have with your health care provider. Document Revised: 12/29/2018 Document Reviewed: 03/26/2018 Elsevier Patient Education  2021 [employer].

## 2020-08-03 LAB — LIPID PANEL
Cholesterol: 163 mg/dL (ref 0–200)
HDL: 51.4 mg/dL (ref >39.00)
LDL Cholesterol: 100 mg/dL — ABNORMAL HIGH (ref 0–99)
NonHDL: 111.93
Total CHOL/HDL Ratio: 3
Triglycerides: 59 mg/dL (ref 0.0–149.0)
VLDL: 11.8 mg/dL (ref 0.0–40.0)

## 2020-08-03 LAB — HEMOGLOBIN A1C: Hgb A1c MFr Bld: 6 % (ref 4.6–6.5)

## 2020-08-03 LAB — CBC
HCT: 49.2 % (ref 39.0–52.0)
Hemoglobin: 16.3 g/dL (ref 13.0–17.0)
MCHC: 33.1 g/dL (ref 30.0–36.0)
MCV: 91.2 fl (ref 78.0–100.0)
Platelets: 335 10*3/uL (ref 150.0–400.0)
RBC: 5.39 Mil/uL (ref 4.22–5.81)
RDW: 13.2 % (ref 11.5–15.5)
WBC: 7.6 10*3/uL (ref 4.0–10.5)

## 2020-08-03 LAB — COMPREHENSIVE METABOLIC PANEL
ALT: 14 U/L (ref 0–53)
AST: 18 U/L (ref 0–37)
Albumin: 4.3 g/dL (ref 3.5–5.2)
Alkaline Phosphatase: 119 U/L — ABNORMAL HIGH (ref 39–117)
BUN: 18 mg/dL (ref 6–23)
CO2: 30 mEq/L (ref 19–32)
Calcium: 10.1 mg/dL (ref 8.4–10.5)
Chloride: 102 mEq/L (ref 96–112)
Creatinine, Ser: 1.47 mg/dL (ref 0.40–1.50)
GFR: 55.79 mL/min — ABNORMAL LOW (ref >60.00)
Glucose, Bld: 97 mg/dL (ref 70–99)
Potassium: 5 mEq/L (ref 3.5–5.1)
Sodium: 139 mEq/L (ref 135–145)
Total Bilirubin: 0.6 mg/dL (ref 0.2–1.2)
Total Protein: 7.4 g/dL (ref 6.0–8.3)

## 2020-08-03 LAB — TSH: TSH: 0.95 u[IU]/mL (ref 0.35–4.50)

## 2020-09-28 ENCOUNTER — Encounter: Payer: Self-pay | Admitting: Gastroenterology

## 2020-11-05 ENCOUNTER — Other Ambulatory Visit: Payer: Self-pay | Admitting: Primary Care

## 2020-11-05 DIAGNOSIS — I10 Essential (primary) hypertension: Secondary | ICD-10-CM

## 2020-11-16 ENCOUNTER — Ambulatory Visit (AMBULATORY_SURGERY_CENTER): Payer: Self-pay | Admitting: *Deleted

## 2020-11-16 ENCOUNTER — Other Ambulatory Visit: Payer: Self-pay

## 2020-11-16 VITALS — Ht 68.0 in | Wt 212.6 lb

## 2020-11-16 DIAGNOSIS — Z1211 Encounter for screening for malignant neoplasm of colon: Secondary | ICD-10-CM

## 2020-11-16 MED ORDER — PEG 3350-KCL-NA BICARB-NACL 420 G PO SOLR: 4000.0000 mL | Freq: Once | ORAL | 0 refills | Status: AC

## 2020-11-16 NOTE — Progress Notes (Signed)
Pt has never had surgery- denies trouble moving neck  No egg or soy allergy  No home oxygen use   No medications for weight loss taken  emmi information given  Pt denies constipation issues  Pt informed that we do not do prior authorizations for prep  Work note for 12-01-20 given

## 2020-12-01 ENCOUNTER — Encounter: Payer: Self-pay | Admitting: Gastroenterology

## 2020-12-01 ENCOUNTER — Other Ambulatory Visit: Payer: Self-pay

## 2020-12-01 ENCOUNTER — Ambulatory Visit (AMBULATORY_SURGERY_CENTER): Payer: BC Managed Care – PPO | Admitting: Gastroenterology

## 2020-12-01 VITALS — BP 110/67 | HR 67 | Temp 97.3°F | Resp 13 | Ht 68.0 in | Wt 212.6 lb

## 2020-12-01 DIAGNOSIS — Z1211 Encounter for screening for malignant neoplasm of colon: Secondary | ICD-10-CM | POA: Diagnosis not present

## 2020-12-01 MED ORDER — SODIUM CHLORIDE 0.9 % IV SOLN
500.0000 mL | Freq: Once | INTRAVENOUS | Status: DC
Start: 2020-12-01 — End: 2023-02-14

## 2020-12-01 NOTE — Op Note (Signed)
[facility] Patient Name: Benjamin Griffith Procedure Date: 12/01/2020 11:18 AM MRN: [id number] Endoscopist: Justice Britain , MD Age: 50 Referring MD:  Date of Birth: [birth year] Gender: Male Account #: [id number] Procedure:                Colonoscopy Indications:              Screening for colorectal malignant neoplasm, This                            is the patient's first colonoscopy Medicines:                Monitored Anesthesia Care Procedure:                Pre-Anesthesia Assessment:                           - Prior to the procedure, a History and Physical                            was performed, and patient medications and                            allergies were reviewed. The patient's tolerance of                            previous anesthesia was also reviewed. The risks                            and benefits of the procedure and the sedation                            options and risks were discussed with the patient.                            All questions were answered, and informed consent                            was obtained. Prior Anticoagulants: The patient has                            taken no previous anticoagulant or antiplatelet                            agents except for NSAID medication. ASA Grade                            Assessment: II - A patient with mild systemic                            disease. After reviewing the risks and benefits,                            the patient was deemed in satisfactory condition to  undergo the procedure.                           After obtaining informed consent, the colonoscope                            was passed under direct vision. Throughout the                            procedure, the patient's blood pressure, pulse, and                            oxygen saturations were monitored continuously. The                            CF [id number] #[id number] was introduced through the  anus                            and advanced to the the cecum, identified by the                            appendiceal orifice. The colonoscopy was performed                            without difficulty. The patient tolerated the                            procedure. The quality of the bowel preparation was                            good. The terminal ileum, ileocecal valve,                            appendiceal orifice, and rectum were photographed. Scope In: 11:27:05 AM Scope Out: 11:37:54 AM Scope Withdrawal Time: 0 hours 8 minutes 37 seconds  Total Procedure Duration: 0 hours 10 minutes 49 seconds  Findings:                 The digital rectal exam was normal. Pertinent                            negatives include no palpable rectal lesions.                           The terminal ileum and ileocecal valve appeared                            normal.                           Normal mucosa was found in the entire colon.                           Non-bleeding non-thrombosed internal hemorrhoids  were found during retroflexion and during                            endoscopy. The hemorrhoids were Grade I (internal                            hemorrhoids that do not prolapse). Complications:            No immediate complications. Estimated Blood Loss:     Estimated blood loss: none. Impression:               - The examined portion of the ileum was normal.                           - Normal mucosa in the entire examined colon.                           - Non-bleeding non-thrombosed internal hemorrhoids. Recommendation:           - The patient will be observed post-procedure,                            until all discharge criteria are met.                           - Discharge patient to home.                           - Patient has a contact number available for                            emergencies. The signs and symptoms of potential                             delayed complications were discussed with the                            patient. Return to normal activities tomorrow.                            Written discharge instructions were provided to the                            patient.                           - High fiber diet.                           - Use FiberCon 1-2 tablets PO daily.                           - Continue present medications.                           - Repeat colonoscopy in 10 years for screening  purposes.                           - The findings and recommendations were discussed                            with the patient.                           - The findings and recommendations were discussed                            with the patient's family. Justice Britain, MD 12/01/2020 11:41:15 AM

## 2020-12-01 NOTE — Progress Notes (Signed)
PT taken to PACU. Monitors in place. VSS. Report given to RN. 

## 2020-12-01 NOTE — Progress Notes (Signed)
GASTROENTEROLOGY PROCEDURE H&P NOTE   Primary Care Physician: Pleas Koch, NP  HPI: Benjamin Griffith is a 50 y.o. male who presents for Colonoscopy for screening.  Past Medical History:  Diagnosis Date   Allergy    COVID-19 virus infection    Hypertension    Polycystic kidney disease    Prediabetes    No past surgical history on file. Current Outpatient Medications  Medication Sig Dispense Refill   AMBULATORY NON FORMULARY MEDICATION Medication Name: Goli Gummie as needed for sleep     amLODipine-benazepril (LOTREL) 10-40 MG capsule TAKE 1 CAPSULE BY MOUTH DAILY. FOR BLOOD PRESSURE. 90 capsule 2   cetirizine (ZYRTEC) 10 MG tablet Take 10 mg by mouth daily as needed for allergies.     ibuprofen (ADVIL,MOTRIN) 200 MG tablet Take 200 mg by mouth every 6 (six) hours as needed for moderate pain.     Multiple Vitamin (MULTIVITAMIN WITH MINERALS) TABS tablet Take 1 tablet by mouth daily. [employer] advanced testosterone daily     Omega-3 Fatty Acids (FISH OIL PO) Take 1 capsule by mouth 3 (three) times daily.     No current facility-administered medications for this visit.    Current Outpatient Medications:    AMBULATORY NON FORMULARY MEDICATION, Medication Name: Goli Gummie as needed for sleep, Disp: , Rfl:    amLODipine-benazepril (LOTREL) 10-40 MG capsule, TAKE 1 CAPSULE BY MOUTH DAILY. FOR BLOOD PRESSURE., Disp: 90 capsule, Rfl: 2   cetirizine (ZYRTEC) 10 MG tablet, Take 10 mg by mouth daily as needed for allergies., Disp: , Rfl:    ibuprofen (ADVIL,MOTRIN) 200 MG tablet, Take 200 mg by mouth every 6 (six) hours as needed for moderate pain., Disp: , Rfl:    Multiple Vitamin (MULTIVITAMIN WITH MINERALS) TABS tablet, Take 1 tablet by mouth daily. [employer] advanced testosterone daily, Disp: , Rfl:    Omega-3 Fatty Acids (FISH OIL PO), Take 1 capsule by mouth 3 (three) times daily., Disp: , Rfl:  No Known Allergies Family History  Problem Relation Age of Onset   Hypertension Mother     Prostate cancer Father    Diabetes Father    Hypertension Father    Kidney disease Maternal Grandmother    Kidney disease Paternal Grandmother    Colon cancer Neg Hx    Esophageal cancer Neg Hx    Rectal cancer Neg Hx    Stomach cancer Neg Hx    Social History   Socioeconomic History   Marital status: Married    Spouse name: Not on file   Number of children: Not on file   Years of education: Not on file   Highest education level: Not on file  Occupational History   Not on file  Tobacco Use   Smoking status: Never   Smokeless tobacco: Never  Vaping Use   Vaping Use: Never used  Substance and Sexual Activity   Alcohol use: No    Alcohol/week: 0.0 standard drinks   Drug use: No   Sexual activity: Not on file  Other Topics Concern   Not on file  Social History Narrative   Not on file   Social Determinants of Health   Financial Resource Strain: Not on file  Food Insecurity: Not on file  Transportation Needs: Not on file  Physical Activity: Not on file  Stress: Not on file  Social Connections: Not on file  Intimate Partner Violence: Not on file    Physical Exam: Vital signs in last 24 hours: '@VSRANGES'$ @   GEN:  NAD EYE: Sclerae anicteric ENT: MMM CV: Non-tachycardic GI: Soft, NT/ND NEURO:  Alert & Oriented x 3  Lab Results: No results for input(s): WBC, HGB, HCT, PLT in the last 72 hours. BMET No results for input(s): NA, K, CL, CO2, GLUCOSE, BUN, CREATININE, CALCIUM in the last 72 hours. LFT No results for input(s): PROT, ALBUMIN, AST, ALT, ALKPHOS, BILITOT, BILIDIR, IBILI in the last 72 hours. PT/INR No results for input(s): LABPROT, INR in the last 72 hours.   Impression / Plan: This is a 50 y.o.male who presents for Colonoscopy for screening.  The risks and benefits of endoscopic evaluation/treatment were discussed with the patient and/or family; these include but are not limited to the risk of perforation, infection, bleeding, missed lesions, lack of  diagnosis, severe illness requiring hospitalization, as well as anesthesia and sedation related illnesses.  The patient's history has been reviewed, patient examined, no change in status, and deemed stable for procedure.  The patient and/or family is agreeable to proceed.    Benjamin Britain, MD [facility] Office # [phone]

## 2020-12-01 NOTE — Patient Instructions (Signed)
Resume previous medications.  Internal hemorrhoids. High fiber diet and daily FiberCon 1-2 tablets recommended  Handouts on findings given to patient.   Repeat colonoscopy in 10 years.   YOU HAD AN ENDOSCOPIC PROCEDURE TODAY AT [facility]:   Refer to the procedure report that was given to you for any specific questions about what was found during the examination.  If the procedure report does not answer your questions, please call your gastroenterologist to clarify.  If you requested that your care partner not be given the details of your procedure findings, then the procedure report has been included in a sealed envelope for you to review at your convenience later.  YOU SHOULD EXPECT: Some feelings of bloating in the abdomen. Passage of more gas than usual.  Walking can help get rid of the air that was put into your GI tract during the procedure and reduce the bloating. If you had a lower endoscopy (such as a colonoscopy or flexible sigmoidoscopy) you may notice spotting of blood in your stool or on the toilet paper. If you underwent a bowel prep for your procedure, you may not have a normal bowel movement for a few days.  Please Note:  You might notice some irritation and congestion in your nose or some drainage.  This is from the oxygen used during your procedure.  There is no need for concern and it should clear up in a day or so.  SYMPTOMS TO REPORT IMMEDIATELY:  Following lower endoscopy (colonoscopy or flexible sigmoidoscopy):  Excessive amounts of blood in the stool  Significant tenderness or worsening of abdominal pains  Swelling of the abdomen that is new, acute  Fever of 100F or higher   For urgent or emergent issues, a gastroenterologist can be reached at any hour by calling [phone]. Do not use MyChart messaging for urgent concerns.    DIET:  We do recommend a small meal at first, but then you may proceed to your regular diet.  Drink plenty of fluids but  you should avoid alcoholic beverages for 24 hours.  ACTIVITY:  You should plan to take it easy for the rest of today and you should NOT DRIVE or use heavy machinery until tomorrow (because of the sedation medicines used during the test).    FOLLOW UP: Our staff will call the number listed on your records 48-72 hours following your procedure to check on you and address any questions or concerns that you may have regarding the information given to you following your procedure. If we do not reach you, we will leave a message.  We will attempt to reach you two times.  During this call, we will ask if you have developed any symptoms of COVID 19. If you develop any symptoms (ie: fever, flu-like symptoms, shortness of breath, cough etc.) before then, please call [phone].  If you test positive for Covid 19 in the 2 weeks post procedure, please call and report this information to [place].    If any biopsies were taken you will be contacted by phone or by letter within the next 1-3 weeks.  Please call us at [phone] if you have not heard about the biopsies in 3 weeks.    SIGNATURES/CONFIDENTIALITY: You and/or your care partner have signed paperwork which will be entered into your electronic medical record.  These signatures attest to the fact that that the information above on your After Visit Summary has been reviewed and is understood.  Full responsibility of  the confidentiality of this discharge information lies with you and/or your care-partner.

## 2020-12-05 ENCOUNTER — Telehealth: Payer: Self-pay | Admitting: *Deleted

## 2020-12-05 NOTE — Telephone Encounter (Signed)
Message left

## 2020-12-05 NOTE — Telephone Encounter (Signed)
  Follow up Call-  Call back number 12/01/2020  Post procedure Call Back phone  # [phone]  Permission to leave phone message Yes  Some recent data might be hidden     Patient questions:  Do you have a fever, pain , or abdominal swelling? No. Pain Score  0 *  Have you tolerated food without any problems? Yes.    Have you been able to return to your normal activities? Yes.    Do you have any questions about your discharge instructions: Diet   No. Medications  No. Follow up visit  No.  Do you have questions or concerns about your Care? No.  Actions: * If pain score is 4 or above: No action needed, pain <4.  Have you developed a fever since your procedure? no  2.   Have you had an respiratory symptoms (SOB or cough) since your procedure? no  3.   Have you tested positive for COVID 19 since your procedure no  4.   Have you had any family members/close contacts diagnosed with the COVID 19 since your procedure?  no   If yes to any of these questions please route to Joylene John, RN and Joella Prince, RN

## 2021-02-06 ENCOUNTER — Telehealth: Payer: Self-pay | Admitting: Primary Care

## 2021-02-06 NOTE — Telephone Encounter (Signed)
Pt wants to know if he can get a refill on amLODipine-benazepril 10-40 MG capsule Commonly known as: LOTREL also pt  has a cpe/lab apt on 11.3.22

## 2021-02-06 NOTE — Telephone Encounter (Signed)
Please notify patient that I provided him with enough refills of his amlodipine-benazepril to last until April 2023.  He should call his pharmacy for refill.  Also, he is not due for CPE until April 2023, not sure why he is scheduled for another CPE next week?  Did he switch insurance companies?  I do not need to see him until April 2023, unless he wants to be seen on 11 3 as scheduled.

## 2021-02-07 NOTE — Telephone Encounter (Signed)
Left message to return call to our office.  

## 2021-02-09 NOTE — Telephone Encounter (Signed)
Called all numbers for patient no answer called wife on dpr. Reviewed all information they will call for refill from pharmacy. They wanted to follow up on kidneys. Would like to keep appointment just to check in. I have changed to office visit. They will call if any questions. No further action needed at this time.

## 2021-02-15 ENCOUNTER — Other Ambulatory Visit: Payer: Self-pay

## 2021-02-15 ENCOUNTER — Ambulatory Visit: Payer: BC Managed Care – PPO | Admitting: Primary Care

## 2021-02-15 ENCOUNTER — Encounter: Payer: Self-pay | Admitting: Primary Care

## 2021-02-15 ENCOUNTER — Encounter: Payer: BC Managed Care – PPO | Admitting: Primary Care

## 2021-02-15 DIAGNOSIS — R7303 Prediabetes: Secondary | ICD-10-CM

## 2021-02-15 DIAGNOSIS — I10 Essential (primary) hypertension: Secondary | ICD-10-CM

## 2021-02-15 DIAGNOSIS — Q613 Polycystic kidney, unspecified: Secondary | ICD-10-CM | POA: Diagnosis not present

## 2021-02-15 NOTE — Assessment & Plan Note (Signed)
A1C of 6.0 in April 2022.  Repeat A1C pending for today.  Discussed the importance of a healthy diet and regular exercise in order for weight loss, and to reduce the risk of further co-morbidity.

## 2021-02-15 NOTE — Assessment & Plan Note (Signed)
Managed on ACE-I, continue same. Repeat BMP pending.

## 2021-02-15 NOTE — Progress Notes (Signed)
Subjective:    Patient ID: Benjamin Griffith, male    DOB: 05-21-1970, 50 y.o.   MRN: [id number]  HPI  Benjamin Griffith is a very pleasant 50 y.o. male with a history of hypertension, polycystic kidney disease, prediabetes who presents today for follow up. He would like updated labs.  Following with nephrology for polycystic kidneys, last visit was earlier this year. Managed on amlodipine-benazepril 10-40 mg. He does not check BP at home.   He denies headaches, chest pain. He has noticed some gradual visual changes, thinks he may need glasses.   He endorses a fair diet. He exercises three times weekly on average. A1C of 6.0 in April 2022.   BP Readings from Last 3 Encounters:  02/15/21 110/82  12/01/20 110/67  08/02/20 132/74   Wt Readings from Last 3 Encounters:  02/15/21 209 lb (94.8 kg)  12/01/20 212 lb 9.6 oz (96.4 kg)  11/16/20 212 lb 9.6 oz (96.4 kg)      Review of Systems  Respiratory:  Negative for shortness of breath.   Cardiovascular:  Negative for chest pain.  Neurological:  Negative for dizziness and headaches.        Past Medical History:  Diagnosis Date   Allergy    COVID-19 virus infection    Hypertension    Polycystic kidney disease    Prediabetes     Social History   Socioeconomic History   Marital status: Married    Spouse name: Not on file   Number of children: Not on file   Years of education: Not on file   Highest education level: Not on file  Occupational History   Not on file  Tobacco Use   Smoking status: Never   Smokeless tobacco: Never  Vaping Use   Vaping Use: Never used  Substance and Sexual Activity   Alcohol use: No    Alcohol/week: 0.0 standard drinks   Drug use: No   Sexual activity: Not on file  Other Topics Concern   Not on file  Social History Narrative   Not on file   Social Determinants of Health   Financial Resource Strain: Not on file  Food Insecurity: Not on file  Transportation Needs: Not on file  Physical  Activity: Not on file  Stress: Not on file  Social Connections: Not on file  Intimate Partner Violence: Not on file    History reviewed. No pertinent surgical history.  Family History  Problem Relation Age of Onset   Hypertension Mother    Prostate cancer Father    Diabetes Father    Hypertension Father    Kidney disease Maternal Grandmother    Kidney disease Paternal Grandmother    Colon cancer Neg Hx    Esophageal cancer Neg Hx    Rectal cancer Neg Hx    Stomach cancer Neg Hx     No Known Allergies  Current Outpatient Medications on File Prior to Visit  Medication Sig Dispense Refill   acetaminophen (TYLENOL) 325 MG tablet Take 650 mg by mouth every 6 (six) hours as needed.     AMBULATORY NON FORMULARY MEDICATION Medication Name: Goli Gummie as needed for sleep     amLODipine-benazepril (LOTREL) 10-40 MG capsule TAKE 1 CAPSULE BY MOUTH DAILY. FOR BLOOD PRESSURE. 90 capsule 2   cetirizine (ZYRTEC) 10 MG tablet Take 10 mg by mouth daily as needed for allergies.     ibuprofen (ADVIL,MOTRIN) 200 MG tablet Take 200 mg by mouth every 6 (six) hours as needed  for moderate pain.     Misc Natural Products (NF FORMULAS TESTOSTERONE PO) Take by mouth. Otc testosterone booster     Multiple Vitamin (MULTIVITAMIN WITH MINERALS) TABS tablet Take 1 tablet by mouth daily. [employer] advanced testosterone daily     Omega-3 Fatty Acids (FISH OIL PO) Take 1 capsule by mouth 3 (three) times daily.     Current Facility-Administered Medications on File Prior to Visit  Medication Dose Route Frequency Provider Last Rate Last Admin   0.9 %  sodium chloride infusion  500 mL Intravenous Once Mansouraty, Telford Nab., MD        BP 110/82   Pulse 72   Temp (!) 97.4 F (36.3 C) (Temporal)   Ht 5\' 8"  (1.727 m)   Wt 209 lb (94.8 kg)   SpO2 97%   BMI 31.78 kg/m  Objective:   Physical Exam Cardiovascular:     Rate and Rhythm: Normal rate and regular rhythm.  Pulmonary:     Effort: Pulmonary effort is  normal.     Breath sounds: Normal breath sounds. No wheezing or rales.  Musculoskeletal:     Cervical back: Neck supple.  Skin:    General: Skin is warm and dry.  Neurological:     Mental Status: He is alert and oriented to person, place, and time.          Assessment & Plan:      This visit occurred during the SARS-CoV-2 public health emergency.  Safety protocols were in place, including screening questions prior to the visit, additional usage of staff PPE, and extensive cleaning of exam room while observing appropriate contact time as indicated for disinfecting solutions.

## 2021-02-15 NOTE — Patient Instructions (Signed)
Stop by the lab prior to leaving today. I will notify you of your results once received.   We will see you in the Spring!

## 2021-02-15 NOTE — Assessment & Plan Note (Signed)
Well controlled in the office today. Continue amlodipine-benazepril 10-40 mg daily.  Repeat BMP pending.

## 2021-02-16 LAB — BASIC METABOLIC PANEL
BUN: 18 mg/dL (ref 6–23)
CO2: 31 mEq/L (ref 19–32)
Calcium: 9.9 mg/dL (ref 8.4–10.5)
Chloride: 102 mEq/L (ref 96–112)
Creatinine, Ser: 1.63 mg/dL — ABNORMAL HIGH (ref 0.40–1.50)
GFR: 49.1 mL/min — ABNORMAL LOW (ref >60.00)
Glucose, Bld: 96 mg/dL (ref 70–99)
Potassium: 4.7 mEq/L (ref 3.5–5.1)
Sodium: 139 mEq/L (ref 135–145)

## 2021-02-16 LAB — HEMOGLOBIN A1C: Hgb A1c MFr Bld: 6 % (ref 4.6–6.5)

## 2021-02-20 NOTE — Telephone Encounter (Signed)
Are we able to fax his recent BMP to his nephrologist?

## 2021-04-27 ENCOUNTER — Ambulatory Visit: Payer: BC Managed Care – PPO | Admitting: Primary Care

## 2021-07-31 ENCOUNTER — Encounter: Payer: BC Managed Care – PPO | Admitting: Primary Care

## 2021-08-24 ENCOUNTER — Ambulatory Visit (INDEPENDENT_AMBULATORY_CARE_PROVIDER_SITE_OTHER): Payer: BC Managed Care – PPO | Admitting: Primary Care

## 2021-08-24 ENCOUNTER — Encounter: Payer: Self-pay | Admitting: Primary Care

## 2021-08-24 VITALS — BP 110/62 | HR 68 | Temp 98.5°F | Ht 68.0 in | Wt 207.0 lb

## 2021-08-24 DIAGNOSIS — Q613 Polycystic kidney, unspecified: Secondary | ICD-10-CM | POA: Diagnosis not present

## 2021-08-24 DIAGNOSIS — Z125 Encounter for screening for malignant neoplasm of prostate: Secondary | ICD-10-CM

## 2021-08-24 DIAGNOSIS — M25561 Pain in right knee: Secondary | ICD-10-CM

## 2021-08-24 DIAGNOSIS — Z0000 Encounter for general adult medical examination without abnormal findings: Secondary | ICD-10-CM

## 2021-08-24 DIAGNOSIS — R7303 Prediabetes: Secondary | ICD-10-CM

## 2021-08-24 DIAGNOSIS — I10 Essential (primary) hypertension: Secondary | ICD-10-CM

## 2021-08-24 DIAGNOSIS — G8929 Other chronic pain: Secondary | ICD-10-CM

## 2021-08-24 NOTE — Assessment & Plan Note (Signed)
Controlled. ? ?Continue amlodipine-benazepril 10-40 mg.  ?CMP pending. ?

## 2021-08-24 NOTE — Patient Instructions (Signed)
Stop by the lab prior to leaving today. I will notify you of your results once received.  ? ?Think about the shingles vaccines.  Schedule a nurse visit if you are interested.  The second shingles vaccine is due 2 to 6 months after the first. ? ?It was a pleasure to see you today! ? ?Preventive Care 50-50 Years Old, Male ?Preventive care refers to lifestyle choices and visits with your health care provider that can promote health and wellness. Preventive care visits are also called wellness exams. ?What can I expect for my preventive care visit? ?Counseling ?During your preventive care visit, your health care provider may ask about your: ?Medical history, including: ?Past medical problems. ?Family medical history. ?Current health, including: ?Emotional well-being. ?Home life and relationship well-being. ?Sexual activity. ?Lifestyle, including: ?Alcohol, nicotine or tobacco, and drug use. ?Access to firearms. ?Diet, exercise, and sleep habits. ?Safety issues such as seatbelt and bike helmet use. ?Sunscreen use. ?Work and work Statistician. ?Physical exam ?Your health care provider will check your: ?Height and weight. These may be used to calculate your BMI (body mass index). BMI is a measurement that tells if you are at a healthy weight. ?Waist circumference. This measures the distance around your waistline. This measurement also tells if you are at a healthy weight and may help predict your risk of certain diseases, such as type 2 diabetes and high blood pressure. ?Heart rate and blood pressure. ?Body temperature. ?Skin for abnormal spots. ?What immunizations do I need? ? ?Vaccines are usually given at various ages, according to a schedule. Your health care provider will recommend vaccines for you based on your age, medical history, and lifestyle or other factors, such as travel or where you work. ?What tests do I need? ?Screening ?Your health care provider may recommend screening tests for certain conditions. This  may include: ?Lipid and cholesterol levels. ?Diabetes screening. This is done by checking your blood sugar (glucose) after you have not eaten for a while (fasting). ?Hepatitis B test. ?Hepatitis C test. ?HIV (human immunodeficiency virus) test. ?STI (sexually transmitted infection) testing, if you are at risk. ?Lung cancer screening. ?Prostate cancer screening. ?Colorectal cancer screening. ?Talk with your health care provider about your test results, treatment options, and if necessary, the need for more tests. ?Follow these instructions at home: ?Eating and drinking ? ?Eat a diet that includes fresh fruits and vegetables, whole grains, lean protein, and low-fat dairy products. ?Take vitamin and mineral supplements as recommended by your health care provider. ?Do not drink alcohol if your health care provider tells you not to drink. ?If you drink alcohol: ?Limit how much you have to 0-2 drinks a day. ?Know how much alcohol is in your drink. In the [place] one drink equals one 12 oz bottle of beer (355 mL), one 5 oz glass of wine (148 mL), or one 1? oz glass of hard liquor (44 mL). ?Lifestyle ?Brush your teeth every morning and night with fluoride toothpaste. Floss one time each day. ?Exercise for at least 30 minutes 5 or more days each week. ?Do not use any products that contain nicotine or tobacco. These products include cigarettes, chewing tobacco, and vaping devices, such as e-cigarettes. If you need help quitting, ask your health care provider. ?Do not use drugs. ?If you are sexually active, practice safe sex. Use a condom or other form of protection to prevent STIs. ?Take aspirin only as told by your health care provider. Make sure that you understand how much to take and what  form to take. Work with your health care provider to find out whether it is safe and beneficial for you to take aspirin daily. ?Find healthy ways to manage stress, such as: ?Meditation, yoga, or listening to music. ?Journaling. ?Talking to  a trusted person. ?Spending time with friends and family. ?Minimize exposure to UV radiation to reduce your risk of skin cancer. ?Safety ?Always wear your seat belt while driving or riding in a vehicle. ?Do not drive: ?If you have been drinking alcohol. Do not ride with someone who has been drinking. ?When you are tired or distracted. ?While texting. ?If you have been using any mind-altering substances or drugs. ?Wear a helmet and other protective equipment during sports activities. ?If you have firearms in your house, make sure you follow all gun safety procedures. ?What's next? ?Go to your health care provider once a year for an annual wellness visit. ?Ask your health care provider how often you should have your eyes and teeth checked. ?Stay up to date on all vaccines. ?This information is not intended to replace advice given to you by your health care provider. Make sure you discuss any questions you have with your health care provider. ?Document Revised: 09/27/2020 Document Reviewed: 09/27/2020 ?Elsevier Patient Education ? [place]. ? ?

## 2021-08-24 NOTE — Assessment & Plan Note (Signed)
Discussed the importance of a healthy diet and regular exercise in order for weight loss, and to reduce the risk of further co-morbidity. ? ?Repeat A1C pending. ?

## 2021-08-24 NOTE — Assessment & Plan Note (Addendum)
Shingrix due, he declines today. Other vaccines UTD. ?PSA due and pending. ?Colonoscopy up-to-date due 2032. ? ?Discussed the importance of a healthy diet and regular exercise in order for weight loss, and to reduce the risk of further co-morbidity. ? ?Exam today stable, labs pending ?

## 2021-08-24 NOTE — Progress Notes (Signed)
? ?Subjective:  ? ? Patient ID: Benjamin Griffith, male    DOB: Nov 18, 1970, 52 y.o.   MRN: [id number] ? ?HPI ? ?Benjamin Griffith is a very pleasant 51 y.o. male who presents today for complete physical and follow up of chronic conditions. ? ?He will undergo total right knee replacement per orthopedics around Summer 2023. He does not have this scheduled yet.  ? ?Immunizations: ?-Tetanus: 2016 ?-Influenza: Did not complete last season ?-Covid-19: 3 vaccines ?-Shingles: Never completed, declines today ? ?Diet: Fair diet.  ?Exercise: Regular exercise. ? ?Eye exam: Completed 1 year ago  ?Dental exam: Completed a few years ago  ? ?Colonoscopy: Completed in 2022, due 2032 ?PSA: Due ? ?BP Readings from Last 3 Encounters:  ?08/24/21 110/62  ?02/15/21 110/82  ?12/01/20 110/67  ? ? ? ? ? ?Review of Systems  ?Constitutional:  Negative for unexpected weight change.  ?HENT:  Negative for rhinorrhea.   ?Respiratory:  Negative for cough and shortness of breath.   ?Cardiovascular:  Negative for chest pain.  ?Gastrointestinal:  Negative for constipation and diarrhea.  ?Genitourinary:  Negative for difficulty urinating.  ?Musculoskeletal:  Positive for arthralgias.  ?Skin:  Negative for rash.  ?Allergic/Immunologic: Negative for environmental allergies.  ?Neurological:  Negative for dizziness, numbness and headaches.  ?Psychiatric/Behavioral:  The patient is not nervous/anxious.   ? ?   ? ? ?Past Medical History:  ?Diagnosis Date  ? Allergy   ? COVID-19 virus infection   ? Hypertension   ? Polycystic kidney disease   ? Prediabetes   ? ? ?Social History  ? ?Socioeconomic History  ? Marital status: Married  ?  Spouse name: Not on file  ? Number of children: Not on file  ? Years of education: Not on file  ? Highest education level: Not on file  ?Occupational History  ? Not on file  ?Tobacco Use  ? Smoking status: Never  ? Smokeless tobacco: Never  ?Vaping Use  ? Vaping Use: Never used  ?Substance and Sexual Activity  ? Alcohol use: No  ?   Alcohol/week: 0.0 standard drinks  ? Drug use: No  ? Sexual activity: Not on file  ?Other Topics Concern  ? Not on file  ?Social History Narrative  ? Not on file  ? ?Social Determinants of Health  ? ?Financial Resource Strain: Not on file  ?Food Insecurity: Not on file  ?Transportation Needs: Not on file  ?Physical Activity: Not on file  ?Stress: Not on file  ?Social Connections: Not on file  ?Intimate Partner Violence: Not on file  ? ? ?History reviewed. No pertinent surgical history. ? ?Family History  ?Problem Relation Age of Onset  ? Hypertension Mother   ? Prostate cancer Father   ? Diabetes Father   ? Hypertension Father   ? Kidney disease Maternal Grandmother   ? Kidney disease Paternal Grandmother   ? Colon cancer Neg Hx   ? Esophageal cancer Neg Hx   ? Rectal cancer Neg Hx   ? Stomach cancer Neg Hx   ? ? ?No Known Allergies ? ?Current Outpatient Medications on File Prior to Visit  ?Medication Sig Dispense Refill  ? acetaminophen (TYLENOL) 325 MG tablet Take 650 mg by mouth every 6 (six) hours as needed.    ? amLODipine-benazepril (LOTREL) 10-40 MG capsule TAKE 1 CAPSULE BY MOUTH DAILY. FOR BLOOD PRESSURE. 90 capsule 2  ? cetirizine (ZYRTEC) 10 MG tablet Take 10 mg by mouth daily as needed for allergies.    ? ibuprofen (ADVIL,MOTRIN)  200 MG tablet Take 200 mg by mouth every 6 (six) hours as needed for moderate pain.    ? AMBULATORY NON FORMULARY MEDICATION Medication Name: Meridee Score as needed for sleep (Patient not taking: Reported on 08/24/2021)    ? Misc Natural Products (NF FORMULAS TESTOSTERONE PO) Take by mouth. Otc testosterone booster (Patient not taking: Reported on 08/24/2021)    ? Multiple Vitamin (MULTIVITAMIN WITH MINERALS) TABS tablet Take 1 tablet by mouth daily. [place] advanced testosterone daily (Patient not taking: Reported on 08/24/2021)    ? Omega-3 Fatty Acids (FISH OIL PO) Take 1 capsule by mouth 3 (three) times daily. (Patient not taking: Reported on 08/24/2021)    ? ?Current  Facility-Administered Medications on File Prior to Visit  ?Medication Dose Route Frequency Provider Last Rate Last Admin  ? 0.9 %  sodium chloride infusion  500 mL Intravenous Once Mansouraty, Telford Nab., MD      ? ? ?BP 110/62   Pulse 68   Temp 98.5 ?F (36.9 ?C) (Oral)   Ht '5\' 8"'$  (1.727 m)   Wt 207 lb (93.9 kg)   SpO2 98%   BMI 31.47 kg/m?  ?Objective:  ? Physical Exam ?HENT:  ?   Right Ear: Tympanic membrane and ear canal normal.  ?   Left Ear: Tympanic membrane and ear canal normal.  ?   Nose: Nose normal.  ?   Right Sinus: No maxillary sinus tenderness or frontal sinus tenderness.  ?   Left Sinus: No maxillary sinus tenderness or frontal sinus tenderness.  ?Eyes:  ?   Conjunctiva/sclera: Conjunctivae normal.  ?Neck:  ?   Thyroid: No thyromegaly.  ?   Vascular: No carotid bruit.  ?Cardiovascular:  ?   Rate and Rhythm: Normal rate and regular rhythm.  ?   Heart sounds: Normal heart sounds.  ?Pulmonary:  ?   Effort: Pulmonary effort is normal.  ?   Breath sounds: Normal breath sounds. No wheezing or rales.  ?Abdominal:  ?   General: Bowel sounds are normal.  ?   Palpations: Abdomen is soft.  ?   Tenderness: There is no abdominal tenderness.  ?Musculoskeletal:     ?   General: Normal range of motion.  ?   Cervical back: Neck supple.  ?Skin: ?   General: Skin is warm and dry.  ?Neurological:  ?   Mental Status: He is alert and oriented to person, place, and time.  ?   Cranial Nerves: No cranial nerve deficit.  ?   Deep Tendon Reflexes: Reflexes are normal and symmetric.  ?Psychiatric:     ?   Mood and Affect: Mood normal.  ? ? ? ? ? ?   ?Assessment & Plan:  ? ? ? ? ?This visit occurred during the SARS-CoV-2 public health emergency.  Safety protocols were in place, including screening questions prior to the visit, additional usage of staff PPE, and extensive cleaning of exam room while observing appropriate contact time as indicated for disinfecting solutions.  ?

## 2021-08-24 NOTE — Assessment & Plan Note (Signed)
Following with orthopedics. ?Pending total right knee replacement for Summer 2023. ? ? ?

## 2021-08-24 NOTE — Assessment & Plan Note (Signed)
Following with nephrology. ?Continue amlodipine-benazepril 10-40 mg daily. ? ?CMP pending. ?

## 2021-08-25 LAB — CBC
HCT: 48.9 % (ref 38.5–50.0)
Hemoglobin: 16.4 g/dL (ref 13.2–17.1)
MCH: 30.4 pg (ref 27.0–33.0)
MCHC: 33.5 g/dL (ref 32.0–36.0)
MCV: 90.6 fL (ref 80.0–100.0)
MPV: 10.5 fL (ref 7.5–12.5)
Platelets: 331 10*3/uL (ref 140–400)
RBC: 5.4 10*6/uL (ref 4.20–5.80)
RDW: 12.5 % (ref 11.0–15.0)
WBC: 7.1 10*3/uL (ref 3.8–10.8)

## 2021-08-25 LAB — COMPREHENSIVE METABOLIC PANEL WITH GFR
AG Ratio: 1.6 (calc) (ref 1.0–2.5)
ALT: 17 U/L (ref 9–46)
AST: 16 U/L (ref 10–35)
Albumin: 4.6 g/dL (ref 3.6–5.1)
Alkaline phosphatase (APISO): 126 U/L (ref 35–144)
BUN/Creatinine Ratio: 12 (calc) (ref 6–22)
BUN: 19 mg/dL (ref 7–25)
CO2: 28 mmol/L (ref 20–32)
Calcium: 10.2 mg/dL (ref 8.6–10.3)
Chloride: 102 mmol/L (ref 98–110)
Creat: 1.62 mg/dL — ABNORMAL HIGH (ref 0.70–1.30)
Globulin: 2.9 g/dL (ref 1.9–3.7)
Glucose, Bld: 108 mg/dL — ABNORMAL HIGH (ref 65–99)
Potassium: 4.9 mmol/L (ref 3.5–5.3)
Sodium: 139 mmol/L (ref 135–146)
Total Bilirubin: 0.7 mg/dL (ref 0.2–1.2)
Total Protein: 7.5 g/dL (ref 6.1–8.1)

## 2021-08-25 LAB — HEMOGLOBIN A1C
Hgb A1c MFr Bld: 5.9 %{Hb} — ABNORMAL HIGH
Mean Plasma Glucose: 123 mg/dL
eAG (mmol/L): 6.8 mmol/L

## 2021-08-25 LAB — LIPID PANEL
Cholesterol: 176 mg/dL (ref <200)
HDL: 53 mg/dL (ref >=40)
LDL Cholesterol (Calc): 109 mg/dL (calc) — ABNORMAL HIGH
Non-HDL Cholesterol (Calc): 123 mg/dL (calc) (ref <130)
Total CHOL/HDL Ratio: 3.3 (calc) (ref <5.0)
Triglycerides: 58 mg/dL (ref <150)

## 2021-08-25 LAB — PSA: PSA: 6.98 ng/mL — ABNORMAL HIGH (ref <=4.00)

## 2021-08-29 ENCOUNTER — Telehealth: Payer: Self-pay

## 2021-08-29 NOTE — Telephone Encounter (Signed)
Patient was seen recently for surgical clearance. After he was in office we received form for clearance. It is requesting an updated EKG. We will need him to set up Nurse visit to have done per [facility]. He does not need to be seen by provider just nurse visit for EKG. I have called and left message for patient to call office. I have form in my folder for after EKG is done and Anda Kraft can review both.  ?

## 2021-08-29 NOTE — Telephone Encounter (Signed)
Nurse visit scheduled for the patient  ?

## 2021-09-13 ENCOUNTER — Ambulatory Visit: Payer: BC Managed Care – PPO

## 2021-11-23 ENCOUNTER — Telehealth: Payer: Self-pay | Admitting: Primary Care

## 2021-11-23 NOTE — Telephone Encounter (Signed)
Please notify patient that we received a form for medical clearance so that he can proceed with his right total knee replacement.  However, he was advised to come several months ago for an EKG so that he can proceed.  Please reschedule his nurse visit so that he can complete his EKG so that we can clear him for surgery.  The form is located in my inbox.

## 2021-11-27 ENCOUNTER — Other Ambulatory Visit: Payer: Self-pay | Admitting: Urology

## 2021-11-27 DIAGNOSIS — C61 Malignant neoplasm of prostate: Secondary | ICD-10-CM

## 2021-11-28 NOTE — Telephone Encounter (Signed)
Have you seen this phone note?

## 2021-11-28 NOTE — Telephone Encounter (Signed)
When I spoke to patient he states that he has contacted their office.

## 2021-11-28 NOTE — Telephone Encounter (Signed)
Noted. He needs to notify his orthopedist.

## 2021-11-28 NOTE — Telephone Encounter (Signed)
Called patient states that he is putting off the procedure. Has had some issues with prostate that he wants to address before hand. He will call us when he is ready to move forward with the surgery.

## 2022-01-19 ENCOUNTER — Ambulatory Visit
Admission: RE | Admit: 2022-01-19 | Discharge: 2022-01-19 | Disposition: A | Discharge status: Home / Self Care | Payer: BC Managed Care – PPO | Source: Ambulatory Visit | Attending: Urology | Admitting: Urology

## 2022-01-19 DIAGNOSIS — C61 Malignant neoplasm of prostate: Secondary | ICD-10-CM

## 2022-01-19 MED ORDER — GADOBUTROL 1 MMOL/ML IV SOLN
10.0000 mL | Freq: Once | INTRAVENOUS | Status: AC | PRN
  Administered 2022-01-19: 10 mL via INTRAVENOUS

## 2022-01-21 ENCOUNTER — Other Ambulatory Visit: Payer: Self-pay | Admitting: Primary Care

## 2022-01-21 DIAGNOSIS — I10 Essential (primary) hypertension: Secondary | ICD-10-CM

## 2022-04-18 ENCOUNTER — Ambulatory Visit: Payer: BC Managed Care – PPO | Admitting: Primary Care

## 2022-04-18 ENCOUNTER — Encounter: Payer: Self-pay | Admitting: Primary Care

## 2022-04-18 VITALS — BP 124/80 | HR 67 | Temp 97.9°F | Ht 68.0 in | Wt 217.0 lb

## 2022-04-18 DIAGNOSIS — R7303 Prediabetes: Secondary | ICD-10-CM

## 2022-04-18 DIAGNOSIS — Q613 Polycystic kidney, unspecified: Secondary | ICD-10-CM | POA: Diagnosis not present

## 2022-04-18 DIAGNOSIS — R1319 Other dysphagia: Secondary | ICD-10-CM | POA: Diagnosis not present

## 2022-04-18 NOTE — Progress Notes (Signed)
Subjective:    Patient ID: Benjamin Griffith, male    DOB: Nov 28, 1970, 52 y.o.   MRN: [id number]  HPI  Dequavious Harshberger is a very pleasant 52 y.o. male  has a past medical history of Allergy, COVID-19 virus infection, Hypertension, Polycystic kidney disease, and Prediabetes. who presents today to discuss several concerns.  1) Polycystic Kidney Disease: Following with urology. Last renal function was in May 2023, creatinine of 1.62, stable compared to November 2022.   2) Elevated PSA: Family history of prostate cancer in father. Following with [facility], last office visit was in late November 2023. He's undergone 2 prostate biopsies in 2023, both with benign results. He underwent MRI prostate which revealed a lesion to the left and right prostate. During his most recent visit recommendations were to continue prostate surveillance every 6 months and MRI every 1-2 years.   3) Dysphagia: Several months ago he began to notice intermittent dysphagia, mid esophageal phase. A few weeks ago he was eating stewed meat, the meat became stuck and he began choking, and he was able to swallow eventually after drinking water.   Typically he has to drink a lot of water before the food will move downward. He has experienced one episode of vomiting with dysphagia.   In general he denies esophageal burning and reflux.   Review of Systems  Cardiovascular:  Negative for chest pain.  Gastrointestinal:  Negative for constipation, diarrhea and nausea.       Dysphagia   Genitourinary:  Negative for difficulty urinating.         Past Medical History:  Diagnosis Date   Allergy    COVID-19 virus infection    Hypertension    Polycystic kidney disease    Prediabetes     Social History   Socioeconomic History   Marital status: Married    Spouse name: Not on file   Number of children: Not on file   Years of education: Not on file   Highest education level: Not on file  Occupational History   Not on file   Tobacco Use   Smoking status: Never   Smokeless tobacco: Never  Vaping Use   Vaping Use: Never used  Substance and Sexual Activity   Alcohol use: No    Alcohol/week: 0.0 standard drinks of alcohol   Drug use: No   Sexual activity: Not on file  Other Topics Concern   Not on file  Social History Narrative   Not on file   Social Determinants of Health   Financial Resource Strain: Not on file  Food Insecurity: Not on file  Transportation Needs: Not on file  Physical Activity: Not on file  Stress: Not on file  Social Connections: Not on file  Intimate Partner Violence: Not on file    History reviewed. No pertinent surgical history.  Family History  Problem Relation Age of Onset   Hypertension Mother    Prostate cancer Father    Diabetes Father    Hypertension Father    Kidney disease Maternal Grandmother    Kidney disease Paternal Grandmother    Colon cancer Neg Hx    Esophageal cancer Neg Hx    Rectal cancer Neg Hx    Stomach cancer Neg Hx     No Known Allergies  Current Outpatient Medications on File Prior to Visit  Medication Sig Dispense Refill   AMBULATORY NON FORMULARY MEDICATION Medication Name: [place] Gummie as needed for sleep     amLODipine-benazepril (LOTREL) 10-40 MG  capsule TAKE 1 CAPSULE BY MOUTH DAILY. FOR BLOOD PRESSURE. 90 capsule 1   magnesium 30 MG tablet Take 30 mg by mouth 2 (two) times daily.     Zinc 20 MG CAPS Take by mouth.     acetaminophen (TYLENOL) 325 MG tablet Take 650 mg by mouth every 6 (six) hours as needed. (Patient not taking: Reported on 04/18/2022)     cetirizine (ZYRTEC) 10 MG tablet Take 10 mg by mouth daily as needed for allergies. (Patient not taking: Reported on 04/18/2022)     ibuprofen (ADVIL,MOTRIN) 200 MG tablet Take 200 mg by mouth every 6 (six) hours as needed for moderate pain. (Patient not taking: Reported on 04/18/2022)     Misc Natural Products (NF FORMULAS TESTOSTERONE PO) Take by mouth. Otc testosterone booster (Patient  not taking: Reported on 08/24/2021)     Multiple Vitamin (MULTIVITAMIN WITH MINERALS) TABS tablet Take 1 tablet by mouth daily. [facility] advanced testosterone daily (Patient not taking: Reported on 08/24/2021)     Omega-3 Fatty Acids (FISH OIL PO) Take 1 capsule by mouth 3 (three) times daily. (Patient not taking: Reported on 08/24/2021)     Current Facility-Administered Medications on File Prior to Visit  Medication Dose Route Frequency Provider Last Rate Last Admin   0.9 %  sodium chloride infusion  500 mL Intravenous Once Mansouraty, Telford Nab., MD        BP 124/80   Pulse 67   Temp 97.9 F (36.6 C) (Temporal)   Ht '5\' 8"'$  (1.727 m)   Wt 217 lb (98.4 kg)   SpO2 99%   BMI 32.99 kg/m  Objective:   Physical Exam Cardiovascular:     Rate and Rhythm: Normal rate and regular rhythm.  Pulmonary:     Effort: Pulmonary effort is normal.     Breath sounds: Normal breath sounds. No wheezing or rales.  Musculoskeletal:     Cervical back: Neck supple.  Skin:    General: Skin is warm and dry.  Neurological:     Mental Status: He is alert and oriented to person, place, and time.           Assessment & Plan:  Polycystic kidney disease Assessment & Plan: Following with urology, office notes reviewed from November 2023. BMP pending.  Orders: -     Basic metabolic panel  Prediabetes Assessment & Plan: Repeat A1C pending.   Discussed the importance of a healthy diet and regular exercise in order for weight loss, and to reduce the risk of further co-morbidity.   Orders: -     Hemoglobin A1c  Esophageal dysphagia Assessment & Plan: Referral placed to GI.  Discussed typical triggers, chew thoroughly.   Orders: -     Ambulatory referral to Gastroenterology        Pleas Koch, NP

## 2022-04-18 NOTE — Assessment & Plan Note (Signed)
Repeat A1C pending.  Discussed the importance of a healthy diet and regular exercise in order for weight loss, and to reduce the risk of further co-morbidity.  

## 2022-04-18 NOTE — Assessment & Plan Note (Signed)
Referral placed to GI.  Discussed typical triggers, chew thoroughly.

## 2022-04-18 NOTE — Patient Instructions (Signed)
Stop by the lab prior to leaving today. I will notify you of your results once received.   You will either be contacted via phone regarding your referral to GI, or you may receive a letter on your MyChart portal from our referral team with instructions for scheduling an appointment. Please let us know if you have not been contacted by anyone within two weeks.  It was a pleasure to see you today!

## 2022-04-18 NOTE — Assessment & Plan Note (Signed)
Following with urology, office notes reviewed from November 2023. BMP pending.

## 2022-04-19 LAB — BASIC METABOLIC PANEL
BUN: 13 mg/dL (ref 6–23)
CO2: 31 mEq/L (ref 19–32)
Calcium: 10.2 mg/dL (ref 8.4–10.5)
Chloride: 101 mEq/L (ref 96–112)
Creatinine, Ser: 1.39 mg/dL (ref 0.40–1.50)
GFR: 58.96 mL/min — ABNORMAL LOW (ref >60.00)
Glucose, Bld: 92 mg/dL (ref 70–99)
Potassium: 4.8 mEq/L (ref 3.5–5.1)
Sodium: 140 mEq/L (ref 135–145)

## 2022-04-19 LAB — HEMOGLOBIN A1C: Hgb A1c MFr Bld: 6.2 % (ref 4.6–6.5)

## 2022-08-20 ENCOUNTER — Encounter: Payer: Self-pay | Admitting: Primary Care

## 2022-08-20 ENCOUNTER — Ambulatory Visit (INDEPENDENT_AMBULATORY_CARE_PROVIDER_SITE_OTHER): Payer: BC Managed Care – PPO | Admitting: Primary Care

## 2022-08-20 VITALS — BP 142/90 | HR 73 | Temp 98.2°F | Ht 68.0 in | Wt 212.0 lb

## 2022-08-20 DIAGNOSIS — R7303 Prediabetes: Secondary | ICD-10-CM | POA: Diagnosis not present

## 2022-08-20 DIAGNOSIS — I10 Essential (primary) hypertension: Secondary | ICD-10-CM

## 2022-08-20 DIAGNOSIS — R5383 Other fatigue: Secondary | ICD-10-CM

## 2022-08-20 DIAGNOSIS — Q613 Polycystic kidney, unspecified: Secondary | ICD-10-CM

## 2022-08-20 DIAGNOSIS — Z0000 Encounter for general adult medical examination without abnormal findings: Secondary | ICD-10-CM

## 2022-08-20 DIAGNOSIS — N401 Enlarged prostate with lower urinary tract symptoms: Secondary | ICD-10-CM

## 2022-08-20 DIAGNOSIS — Z125 Encounter for screening for malignant neoplasm of prostate: Secondary | ICD-10-CM

## 2022-08-20 DIAGNOSIS — N400 Enlarged prostate without lower urinary tract symptoms: Secondary | ICD-10-CM | POA: Insufficient documentation

## 2022-08-20 DIAGNOSIS — R351 Nocturia: Secondary | ICD-10-CM

## 2022-08-20 MED ORDER — AMLODIPINE BESY-BENAZEPRIL HCL 10-40 MG PO CAPS: 1.0000 | ORAL_CAPSULE | Freq: Every day | ORAL | 3 refills | Status: DC

## 2022-08-20 NOTE — Assessment & Plan Note (Signed)
Repeat A1C pending.  Discussed the importance of a healthy diet and regular exercise in order for weight loss, and to reduce the risk of further co-morbidity.  

## 2022-08-20 NOTE — Assessment & Plan Note (Signed)
Declines Shingrix Colonoscopy UTD, due 2032 PSA due and pending.  Discussed the importance of a healthy diet and regular exercise in order for weight loss, and to reduce the risk of further co-morbidity.  Exam stable. Labs pending.  Follow up in 1 year for repeat physical.

## 2022-08-20 NOTE — Patient Instructions (Signed)
Stop by the lab prior to leaving today. I will notify you of your results once received.   Stop drinking caffeine after 3 pm.  Limit sugary drinks and foods after 3 pm.  Limit liquids 1 hour prior to bedtime.  It was a pleasure to see you today!

## 2022-08-20 NOTE — Progress Notes (Signed)
Subjective:    Patient ID: Benjamin Griffith, male    DOB: 16-Mar-1971, 52 y.o.   MRN: [id number]  HPI  Benjamin Griffith is a very pleasant 53 y.o. male who presents today for complete physical and follow up of chronic conditions.  He would also like to discuss fatigue. Occurs daily around 3:30 pm if he doesn't go to the gym. If he goes to the gyn his energy returns, but if he doesn't go then he feels very sluggish at home after work. He does snore some. Also with daytime tiredness and can nap at times if left alone. He goes to bed around 9:30 pm and wakes around 2 am to use the bathroom and sometimes again in a few hours. He drinks mostly soda, lemonade, and sweet tea. He doesn't drink much water at all. He does drink soda at dinner and bedtime.   Immunizations: -Tetanus: Completed in 2016 -Shingles: Never completed, declines   Diet: Fair diet.  Exercise: Regular exercise.  Eye exam: Completed >1 year ago  Dental exam: Completed >1 year ago    Colonoscopy: Completed in 2022, due 2032  PSA: Due   BP Readings from Last 3 Encounters:  08/20/22 (!) 142/90  04/18/22 124/80  08/24/21 110/62        Review of Systems  Constitutional:  Negative for unexpected weight change.  HENT:  Negative for rhinorrhea.   Respiratory:  Negative for cough and shortness of breath.   Cardiovascular:  Negative for chest pain.  Gastrointestinal:  Negative for constipation and diarrhea.  Genitourinary:  Negative for difficulty urinating.  Musculoskeletal:  Negative for arthralgias and myalgias.  Skin:  Negative for rash.  Allergic/Immunologic: Negative for environmental allergies.  Neurological:  Negative for dizziness and headaches.  Psychiatric/Behavioral:  The patient is not nervous/anxious.          Past Medical History:  Diagnosis Date   Allergy    COVID-19 virus infection    Hypertension    Polycystic kidney disease    Prediabetes     Social History   Socioeconomic History   Marital  status: Married    Spouse name: Not on file   Number of children: Not on file   Years of education: Not on file   Highest education level: Not on file  Occupational History   Not on file  Tobacco Use   Smoking status: Never   Smokeless tobacco: Never  Vaping Use   Vaping Use: Never used  Substance and Sexual Activity   Alcohol use: No    Alcohol/week: 0.0 standard drinks of alcohol   Drug use: No   Sexual activity: Not on file  Other Topics Concern   Not on file  Social History Narrative   Not on file   Social Determinants of Health   Financial Resource Strain: Not on file  Food Insecurity: Not on file  Transportation Needs: Not on file  Physical Activity: Not on file  Stress: Not on file  Social Connections: Not on file  Intimate Partner Violence: Not on file    History reviewed. No pertinent surgical history.  Family History  Problem Relation Age of Onset   Hypertension Mother    Prostate cancer Father    Diabetes Father    Hypertension Father    Kidney disease Maternal Grandmother    Kidney disease Paternal Grandmother    Colon cancer Neg Hx    Esophageal cancer Neg Hx    Rectal cancer Neg Hx    Stomach  cancer Neg Hx     No Known Allergies  Current Outpatient Medications on File Prior to Visit  Medication Sig Dispense Refill   AMBULATORY NON FORMULARY MEDICATION Medication Name: Goli Gummie as needed for sleep     magnesium 30 MG tablet Take 30 mg by mouth 2 (two) times daily.     acetaminophen (TYLENOL) 325 MG tablet Take 650 mg by mouth every 6 (six) hours as needed. (Patient not taking: Reported on 04/18/2022)     Omega-3 Fatty Acids (FISH OIL PO) Take 1 capsule by mouth 3 (three) times daily. (Patient not taking: Reported on 08/24/2021)     Current Facility-Administered Medications on File Prior to Visit  Medication Dose Route Frequency Provider Last Rate Last Admin   0.9 %  sodium chloride infusion  500 mL Intravenous Once Mansouraty, Netty Starring., MD         BP (!) 142/90   Pulse 73   Temp 98.2 F (36.8 C) (Temporal)   Ht 5\' 8"  (1.727 m)   Wt 212 lb (96.2 kg)   SpO2 96%   BMI 32.23 kg/m  Objective:   Physical Exam HENT:     Right Ear: Tympanic membrane and ear canal normal.     Left Ear: Tympanic membrane and ear canal normal.     Nose: Nose normal.     Right Sinus: No maxillary sinus tenderness or frontal sinus tenderness.     Left Sinus: No maxillary sinus tenderness or frontal sinus tenderness.  Eyes:     Conjunctiva/sclera: Conjunctivae normal.  Neck:     Thyroid: No thyromegaly.     Vascular: No carotid bruit.  Cardiovascular:     Rate and Rhythm: Normal rate and regular rhythm.     Heart sounds: Normal heart sounds.  Pulmonary:     Effort: Pulmonary effort is normal.     Breath sounds: Normal breath sounds. No wheezing or rales.  Abdominal:     General: Bowel sounds are normal.     Palpations: Abdomen is soft.     Tenderness: There is no abdominal tenderness.  Musculoskeletal:        General: Normal range of motion.     Cervical back: Neck supple.  Skin:    General: Skin is warm and dry.  Neurological:     Mental Status: He is alert and oriented to person, place, and time.     Cranial Nerves: No cranial nerve deficit.     Deep Tendon Reflexes: Reflexes are normal and symmetric.  Psychiatric:        Mood and Affect: Mood normal.           Assessment & Plan:  Preventative health care Assessment & Plan: Declines Shingrix Colonoscopy UTD, due 2032 PSA due and pending.  Discussed the importance of a healthy diet and regular exercise in order for weight loss, and to reduce the risk of further co-morbidity.  Exam stable. Labs pending.  Follow up in 1 year for repeat physical.    Benign prostatic hyperplasia with nocturia Assessment & Plan: Following with urology, reviewed office notes from December 2023. Declines treatment today.    Prediabetes Assessment & Plan: Repeat A1C  pending.  Discussed the importance of a healthy diet and regular exercise in order for weight loss, and to reduce the risk of further co-morbidity.   Orders: -     Hemoglobin A1c -     Comprehensive metabolic panel  Polycystic kidney disease Assessment & Plan: Following with Urology.  Reviewed office notes from December 2023. Strongly advised he increase water intake.   Essential hypertension Assessment & Plan: Above goal today, also on recheck.  Continue amlodipine-benazepril 10-40 mg daily. Will have him monitor BP at home, report elevated readings.  Work on lifestyle changes, cutting back sugary drinks, increasing water intake.  CMP pending.  Orders: -     Lipid panel -     Comprehensive metabolic panel -     CBC -     amLODIPine Besy-Benazepril HCl; Take 1 capsule by mouth daily. For blood pressure.  Dispense: 90 capsule; Refill: 3  Fatigue, unspecified type Assessment & Plan: Checking labs,  Discussed that his nocturia may be playing a role. Discussed to stop drinking caffeine at 12 pm, no soda, sweet tea, or lemonade with dinner. Discussed to limit liquids at bedtime.  Increase water consumption during the day.  Consider sleep study. He will update.    Screening for prostate cancer -     PSA        Doreene Nest, NP

## 2022-08-20 NOTE — Assessment & Plan Note (Signed)
Following with urology, reviewed office notes from December 2023. Declines treatment today.

## 2022-08-20 NOTE — Assessment & Plan Note (Signed)
Checking labs,  Discussed that his nocturia may be playing a role. Discussed to stop drinking caffeine at 12 pm, no soda, sweet tea, or lemonade with dinner. Discussed to limit liquids at bedtime.  Increase water consumption during the day.  Consider sleep study. He will update.

## 2022-08-20 NOTE — Assessment & Plan Note (Signed)
Above goal today, also on recheck.  Continue amlodipine-benazepril 10-40 mg daily. Will have him monitor BP at home, report elevated readings.  Work on lifestyle changes, cutting back sugary drinks, increasing water intake.  CMP pending.

## 2022-08-20 NOTE — Assessment & Plan Note (Addendum)
Following with Urology. Reviewed office notes from December 2023. Strongly advised he increase water intake.

## 2022-08-21 LAB — CBC
HCT: 47.2 % (ref 39.0–52.0)
Hemoglobin: 15.9 g/dL (ref 13.0–17.0)
MCHC: 33.6 g/dL (ref 30.0–36.0)
MCV: 91.9 fl (ref 78.0–100.0)
Platelets: 321 10*3/uL (ref 150.0–400.0)
RBC: 5.14 Mil/uL (ref 4.22–5.81)
RDW: 13.6 % (ref 11.5–15.5)
WBC: 7.4 10*3/uL (ref 4.0–10.5)

## 2022-08-21 LAB — PSA: PSA: 9.17 ng/mL — ABNORMAL HIGH (ref 0.10–4.00)

## 2022-08-21 LAB — COMPREHENSIVE METABOLIC PANEL
ALT: 15 U/L (ref 0–53)
AST: 16 U/L (ref 0–37)
Albumin: 4.3 g/dL (ref 3.5–5.2)
Alkaline Phosphatase: 128 U/L — ABNORMAL HIGH (ref 39–117)
BUN: 20 mg/dL (ref 6–23)
CO2: 27 mEq/L (ref 19–32)
Calcium: 9.6 mg/dL (ref 8.4–10.5)
Chloride: 105 mEq/L (ref 96–112)
Creatinine, Ser: 1.39 mg/dL (ref 0.40–1.50)
GFR: 58.82 mL/min — ABNORMAL LOW (ref >60.00)
Glucose, Bld: 92 mg/dL (ref 70–99)
Potassium: 4.7 mEq/L (ref 3.5–5.1)
Sodium: 141 mEq/L (ref 135–145)
Total Bilirubin: 0.4 mg/dL (ref 0.2–1.2)
Total Protein: 7.4 g/dL (ref 6.0–8.3)

## 2022-08-21 LAB — LIPID PANEL
Cholesterol: 161 mg/dL (ref 0–200)
HDL: 45.3 mg/dL (ref >39.00)
LDL Cholesterol: 96 mg/dL (ref 0–99)
NonHDL: 115.73
Total CHOL/HDL Ratio: 4
Triglycerides: 100 mg/dL (ref 0.0–149.0)
VLDL: 20 mg/dL (ref 0.0–40.0)

## 2022-08-21 LAB — HEMOGLOBIN A1C: Hgb A1c MFr Bld: 6.2 % (ref 4.6–6.5)

## 2023-01-29 ENCOUNTER — Other Ambulatory Visit: Payer: Self-pay | Admitting: Nurse Practitioner

## 2023-01-29 DIAGNOSIS — R972 Elevated prostate specific antigen [PSA]: Secondary | ICD-10-CM

## 2023-01-29 DIAGNOSIS — C61 Malignant neoplasm of prostate: Secondary | ICD-10-CM

## 2023-01-29 DIAGNOSIS — N4232 Atypical small acinar proliferation of prostate: Secondary | ICD-10-CM

## 2023-02-14 ENCOUNTER — Ambulatory Visit: Payer: BC Managed Care – PPO | Admitting: Primary Care

## 2023-02-14 ENCOUNTER — Encounter: Payer: Self-pay | Admitting: Primary Care

## 2023-02-14 VITALS — BP 136/88 | HR 78 | Temp 97.6°F | Ht 68.0 in | Wt 205.0 lb

## 2023-02-14 DIAGNOSIS — Q613 Polycystic kidney, unspecified: Secondary | ICD-10-CM

## 2023-02-14 DIAGNOSIS — Z125 Encounter for screening for malignant neoplasm of prostate: Secondary | ICD-10-CM

## 2023-02-14 DIAGNOSIS — R972 Elevated prostate specific antigen [PSA]: Secondary | ICD-10-CM | POA: Insufficient documentation

## 2023-02-14 DIAGNOSIS — R7303 Prediabetes: Secondary | ICD-10-CM | POA: Diagnosis not present

## 2023-02-14 DIAGNOSIS — I10 Essential (primary) hypertension: Secondary | ICD-10-CM

## 2023-02-14 LAB — BASIC METABOLIC PANEL
BUN: 19 mg/dL (ref 6–23)
CO2: 30 meq/L (ref 19–32)
Calcium: 10.2 mg/dL (ref 8.4–10.5)
Chloride: 102 meq/L (ref 96–112)
Creatinine, Ser: 1.51 mg/dL — ABNORMAL HIGH (ref 0.40–1.50)
GFR: 53.07 mL/min — ABNORMAL LOW (ref >60.00)
Glucose, Bld: 108 mg/dL — ABNORMAL HIGH (ref 70–99)
Potassium: 4.9 meq/L (ref 3.5–5.1)
Sodium: 140 meq/L (ref 135–145)

## 2023-02-14 LAB — PSA: PSA: 13.57 ng/mL — ABNORMAL HIGH (ref 0.10–4.00)

## 2023-02-14 LAB — HEMOGLOBIN A1C: Hgb A1c MFr Bld: 6.4 % (ref 4.6–6.5)

## 2023-02-14 NOTE — Assessment & Plan Note (Signed)
Improved. Commended him on weight loss!  Recommended to monitor readings at home. Continue amlodipine-benazepril 10-40 mg daily  BMP pending

## 2023-02-14 NOTE — Progress Notes (Signed)
Subjective:    Patient ID: Benjamin Griffith, male    DOB: October 09, 1970, 52 y.o.   MRN: [id number]  HPI  Liliana Brentlinger is a very pleasant 52 y.o. male with a history of hypertension, polycystic kidney disease, BPH, prediabetes who presents today for follow up of chronic conditions.  1) Hypertension: Currently managed on amlodipine-benazepril 10-40 mg tablets daily.  He does check his BP at home regularly, thinks it was "high". He denies headaches, dizziness, chest pain.   BP Readings from Last 3 Encounters:  02/14/23 136/88  08/20/22 (!) 142/90  04/18/22 124/80   Wt Readings from Last 3 Encounters:  02/14/23 205 lb (93 kg)  08/20/22 212 lb (96.2 kg)  04/18/22 217 lb (98.4 kg)     2) Prediabetes: Chronic. Last A1C was 6.2 in May 2024.  Previously, A1c was 6.2 in January 2024. Since his last visit he has cut back on sodas and processed/fast food.   3) Polycystic Kidney Disease/Elevated PSA: Following with Urology, last office visit was in June 2024. PSA of 6.98 in May 2023, 9.17 in May 2024, and 8 in June 2024 (per Urology). Plan is to undergo repeat MR prostate in December 2024, he has this scheduled.   He is due for repeat PSA today.  Review of Systems  Respiratory:  Negative for shortness of breath.   Cardiovascular:  Negative for chest pain.  Allergic/Immunologic: Positive for environmental allergies.  Neurological:  Negative for dizziness and headaches.         Past Medical History:  Diagnosis Date   Allergy    COVID-19 virus infection    Hypertension    Polycystic kidney disease    Prediabetes     Social History   Socioeconomic History   Marital status: Married    Spouse name: Not on file   Number of children: Not on file   Years of education: Not on file   Highest education level: Not on file  Occupational History   Not on file  Tobacco Use   Smoking status: Never   Smokeless tobacco: Never  Vaping Use   Vaping status: Never Used  Substance and Sexual  Activity   Alcohol use: No    Alcohol/week: 0.0 standard drinks of alcohol   Drug use: No   Sexual activity: Not on file  Other Topics Concern   Not on file  Social History Narrative   Not on file   Social Determinants of Health   Financial Resource Strain: Not on file  Food Insecurity: Not on file  Transportation Needs: Not on file  Physical Activity: Not on file  Stress: Not on file  Social Connections: Not on file  Intimate Partner Violence: Not on file    History reviewed. No pertinent surgical history.  Family History  Problem Relation Age of Onset   Hypertension Mother    Prostate cancer Father    Diabetes Father    Hypertension Father    Kidney disease Maternal Grandmother    Kidney disease Paternal Grandmother    Colon cancer Neg Hx    Esophageal cancer Neg Hx    Rectal cancer Neg Hx    Stomach cancer Neg Hx     No Known Allergies  Current Outpatient Medications on File Prior to Visit  Medication Sig Dispense Refill   amLODipine-benazepril (LOTREL) 10-40 MG capsule Take 1 capsule by mouth daily. For blood pressure. 90 capsule 3   magnesium 30 MG tablet Take 30 mg by mouth 2 (two) times  daily.     acetaminophen (TYLENOL) 325 MG tablet Take 650 mg by mouth every 6 (six) hours as needed. (Patient not taking: Reported on 04/18/2022)     AMBULATORY NON FORMULARY MEDICATION Medication Name: Alvino Blood as needed for sleep (Patient not taking: Reported on 02/14/2023)     Omega-3 Fatty Acids (FISH OIL PO) Take 1 capsule by mouth 3 (three) times daily. (Patient not taking: Reported on 08/24/2021)     No current facility-administered medications on file prior to visit.    BP 136/88   Pulse 78   Temp 97.6 F (36.4 C) (Temporal)   Ht 5\' 8"  (1.727 m)   Wt 205 lb (93 kg)   SpO2 98%   BMI 31.17 kg/m  Objective:   Physical Exam Cardiovascular:     Rate and Rhythm: Normal rate and regular rhythm.  Pulmonary:     Effort: Pulmonary effort is normal.     Breath  sounds: Normal breath sounds.  Musculoskeletal:     Cervical back: Neck supple.  Skin:    General: Skin is warm and dry.  Neurological:     Mental Status: He is alert and oriented to person, place, and time.  Psychiatric:        Mood and Affect: Mood normal.           Assessment & Plan:  Prediabetes -     Hemoglobin A1c  Polycystic kidney disease Assessment & Plan: Reviewed urology notes from June 2024.  Repeat BMP pending.  Orders: -     Basic metabolic panel  Screening for prostate cancer -     PSA  Essential hypertension Assessment & Plan: Improved. Commended him on weight loss!  Recommended to monitor readings at home. Continue amlodipine-benazepril 10-40 mg daily  BMP pending   Elevated PSA Assessment & Plan: Following with Urology, reviewed office notes from June 2024.  Repeat PSA pending. Proceed with MRI as scheduled.          Doreene Nest, NP

## 2023-02-14 NOTE — Assessment & Plan Note (Signed)
Following with Urology, reviewed office notes from June 2024.  Repeat PSA pending. Proceed with MRI as scheduled.

## 2023-02-14 NOTE — Assessment & Plan Note (Signed)
Reviewed urology notes from June 2024.  Repeat BMP pending.

## 2023-02-14 NOTE — Patient Instructions (Signed)
Stop by the lab prior to leaving today. I will notify you of your results once received.   Monitor your blood pressure at home.  It was a pleasure to see you today!

## 2023-03-28 ENCOUNTER — Encounter: Payer: Self-pay | Admitting: Nurse Practitioner

## 2023-04-04 ENCOUNTER — Ambulatory Visit
Admission: RE | Admit: 2023-04-04 | Discharge: 2023-04-04 | Disposition: A | Discharge status: Home / Self Care | Payer: BC Managed Care – PPO | Source: Ambulatory Visit | Attending: Nurse Practitioner | Admitting: Nurse Practitioner

## 2023-04-04 DIAGNOSIS — C61 Malignant neoplasm of prostate: Secondary | ICD-10-CM

## 2023-04-04 DIAGNOSIS — N4232 Atypical small acinar proliferation of prostate: Secondary | ICD-10-CM

## 2023-04-04 DIAGNOSIS — R972 Elevated prostate specific antigen [PSA]: Secondary | ICD-10-CM

## 2023-04-04 MED ORDER — GADOPICLENOL 0.5 MMOL/ML IV SOLN
9.0000 mL | Freq: Once | INTRAVENOUS | Status: AC | PRN
  Administered 2023-04-04: 9 mL via INTRAVENOUS

## 2023-06-05 ENCOUNTER — Ambulatory Visit: Payer: Self-pay | Admitting: Primary Care

## 2023-06-05 ENCOUNTER — Encounter: Payer: Self-pay | Admitting: Primary Care

## 2023-06-05 VITALS — BP 162/120 | HR 79 | Temp 98.0°F | Ht 68.0 in | Wt 212.0 lb

## 2023-06-05 DIAGNOSIS — I10 Essential (primary) hypertension: Secondary | ICD-10-CM

## 2023-06-05 DIAGNOSIS — R21 Rash and other nonspecific skin eruption: Secondary | ICD-10-CM

## 2023-06-05 MED ORDER — PREDNISONE 20 MG PO TABS: ORAL_TABLET | ORAL | 0 refills | Status: DC

## 2023-06-05 NOTE — Assessment & Plan Note (Addendum)
Exam today representative of contact dermatitis or some form of allergic reaction.  Could be eczema, less representative on exam. Discussed with patient today.  Discussed to stop all new supplements and to switch back to original laundry detergent.  Start prednisone tablets. Take two tablets my mouth once daily in the morning for four days, then one tablet once daily in the morning for four days.  Discussed daily antihistamine such as Zyrtec, Claritin, Allegra.  He will update.

## 2023-06-05 NOTE — Progress Notes (Signed)
Subjective:    Patient ID: Benjamin Griffith, male    DOB: 01-06-1971, 53 y.o.   MRN: [id number]  Rash Pertinent negatives include no shortness of breath.    Benjamin Griffith is a very pleasant 53 y.o. male with a history of hypertension, polycystic kidney disease, BPH, prediabetes, eczema who presents today to discuss rash.  Symptom onset 1 week ago with a bumpy, itchy rash to the forehead and bilateral upper extremities only.   No new lotions, soaps or shampoos. His wife did switch to a different laundry detergent 2 weeks ago.  No new medicines, vitamins. He began taking several new supplements about 1 month ago.  No new pets. No recent outdoor exposure or poison ivy exposure. No bonfire or smoke exposure.  No recent motel or hotel stay or new beds.   No fevers/chills, oral lesions, new joint pains, tick bites, abdominal pain, nausea, wheezing, throat tightness, trouble swallowing.  He has applied neosporin cream and calamine lotion which helps temporarily with itching but without resolve. His itching is worse at night.  BP Readings from Last 3 Encounters:  06/05/23 (!) 162/120  02/14/23 136/88  08/20/22 (!) 142/90   He is not taking his amlodipine-benazepril 10-40 mg daily.   Review of Systems  HENT:  Negative for trouble swallowing.   Respiratory:  Negative for shortness of breath and wheezing.   Skin:  Positive for color change and rash.  Neurological:  Negative for numbness.         Past Medical History:  Diagnosis Date   Allergy    COVID-19 virus infection    Hypertension    Polycystic kidney disease    Prediabetes     Social History   Socioeconomic History   Marital status: Married    Spouse name: Not on file   Number of children: Not on file   Years of education: Not on file   Highest education level: Not on file  Occupational History   Not on file  Tobacco Use   Smoking status: Never   Smokeless tobacco: Never  Vaping Use   Vaping status: Never  Used  Substance and Sexual Activity   Alcohol use: No    Alcohol/week: 0.0 standard drinks of alcohol   Drug use: No   Sexual activity: Not on file  Other Topics Concern   Not on file  Social History Narrative   Not on file   Social Drivers of Health   Financial Resource Strain: Not on file  Food Insecurity: Not on file  Transportation Needs: Not on file  Physical Activity: Not on file  Stress: Not on file  Social Connections: Not on file  Intimate Partner Violence: Not on file    History reviewed. No pertinent surgical history.  Family History  Problem Relation Age of Onset   Hypertension Mother    Prostate cancer Father    Diabetes Father    Hypertension Father    Kidney disease Maternal Grandmother    Kidney disease Paternal Grandmother    Colon cancer Neg Hx    Esophageal cancer Neg Hx    Rectal cancer Neg Hx    Stomach cancer Neg Hx     No Known Allergies  Current Outpatient Medications on File Prior to Visit  Medication Sig Dispense Refill   acetaminophen (TYLENOL) 325 MG tablet Take 650 mg by mouth every 6 (six) hours as needed.     AMBULATORY NON FORMULARY MEDICATION Medication Name: Alvino Blood as needed for  sleep     AMBULATORY NON FORMULARY MEDICATION Take by mouth daily. Medication Name: Agri-chord     AMBULATORY NON FORMULARY MEDICATION Take by mouth in the morning and at bedtime. Medication Name: herbal pumpkin     AMBULATORY NON FORMULARY MEDICATION Medication Name: Lymph-tone     amLODipine-benazepril (LOTREL) 10-40 MG capsule Take 1 capsule by mouth daily. For blood pressure. 90 capsule 3   No current facility-administered medications on file prior to visit.    BP (!) 162/120 (BP Location: Left Arm, Patient Position: Sitting, Cuff Size: Normal)   Pulse 79   Temp 98 F (36.7 C) (Oral)   Ht 5\' 8"  (1.727 m)   Wt 212 lb (96.2 kg)   SpO2 98%   BMI 32.23 kg/m  Objective:   Physical Exam Cardiovascular:     Rate and Rhythm: Normal rate.   Pulmonary:     Effort: Pulmonary effort is normal.  Skin:    General: Skin is warm and dry.     Findings: Erythema and rash present.     Comments: Erythematous rash with mildly raised bumps to bilateral upper extremities from wrist to distal shoulder.  Scaly patches of skin to bilateral anterior forearms lateral to antecubital fossa.  Neurological:     Mental Status: He is alert.           Assessment & Plan:  Rash and nonspecific skin eruption Assessment & Plan: Exam today representative of contact dermatitis or some form of allergic reaction.  Could be eczema, less representative on exam. Discussed with patient today.  Discussed to stop all new supplements and to switch back to original laundry detergent.  Start prednisone tablets. Take two tablets my mouth once daily in the morning for four days, then one tablet once daily in the morning for four days.  Discussed daily antihistamine such as Zyrtec, Claritin, Allegra.  He will update.     Orders: -     predniSONE; Take two tablets my mouth once daily in the morning for four days, then one tablet once daily in the morning for four days.  Dispense: 12 tablet; Refill: 0  Essential hypertension Assessment & Plan: Uncontrolled today, also with noncompliance to hypertensive regimen.  Discussed the importance of blood pressure control and the effects of uncontrolled high blood pressure. Resume amlodipine-benazepril 10-40 milligrams daily.         Doreene Nest, NP

## 2023-06-05 NOTE — Assessment & Plan Note (Signed)
Uncontrolled today, also with noncompliance to hypertensive regimen.  Discussed the importance of blood pressure control and the effects of uncontrolled high blood pressure. Resume amlodipine-benazepril 10-40 milligrams daily.

## 2023-06-05 NOTE — Patient Instructions (Signed)
Start prednisone tablets. Take two tablets my mouth once daily in the morning for four days, then one tablet once daily in the morning for four days.   You could also take an antihistamine such as Zyrtec, Claritin, Allegra for your rash.  Stop all supplements as discussed.  Switch back to your prior deterrent.  Please keep me updated.

## 2023-08-22 ENCOUNTER — Other Ambulatory Visit: Payer: Self-pay | Admitting: Primary Care

## 2023-08-22 DIAGNOSIS — I10 Essential (primary) hypertension: Secondary | ICD-10-CM

## 2024-01-16 ENCOUNTER — Other Ambulatory Visit: Payer: Self-pay | Admitting: Primary Care

## 2024-01-16 DIAGNOSIS — I10 Essential (primary) hypertension: Secondary | ICD-10-CM

## 2024-01-16 NOTE — Telephone Encounter (Signed)
Patient is due for CPE/follow up in mid November, this will be required prior to any further refills.  Please schedule, thank you!

## 2024-03-24 ENCOUNTER — Ambulatory Visit (INDEPENDENT_AMBULATORY_CARE_PROVIDER_SITE_OTHER): Admitting: Primary Care

## 2024-03-24 ENCOUNTER — Encounter: Payer: Self-pay | Admitting: Primary Care

## 2024-03-24 VITALS — BP 136/84 | HR 92 | Temp 97.9°F | Ht 67.25 in | Wt 214.0 lb

## 2024-03-24 DIAGNOSIS — I10 Essential (primary) hypertension: Secondary | ICD-10-CM

## 2024-03-24 DIAGNOSIS — R351 Nocturia: Secondary | ICD-10-CM

## 2024-03-24 DIAGNOSIS — R7303 Prediabetes: Secondary | ICD-10-CM

## 2024-03-24 DIAGNOSIS — Z0000 Encounter for general adult medical examination without abnormal findings: Secondary | ICD-10-CM

## 2024-03-24 DIAGNOSIS — R1319 Other dysphagia: Secondary | ICD-10-CM

## 2024-03-24 DIAGNOSIS — Z8546 Personal history of malignant neoplasm of prostate: Secondary | ICD-10-CM

## 2024-03-24 DIAGNOSIS — Q613 Polycystic kidney, unspecified: Secondary | ICD-10-CM

## 2024-03-24 DIAGNOSIS — N401 Enlarged prostate with lower urinary tract symptoms: Secondary | ICD-10-CM | POA: Diagnosis not present

## 2024-03-24 NOTE — Assessment & Plan Note (Signed)
 Declines influenza vaccine and Shingrix vaccines. Colonoscopy UTD, due 2032 PSA UTD, reviewed from [facility] Everywhere.  Discussed the importance of a healthy diet and regular exercise in order for weight loss, and to reduce the risk of further co-morbidity.  Exam stable. Labs pending.  Follow up in 1 year for repeat physical.

## 2024-03-24 NOTE — Progress Notes (Signed)
 Subjective:    Patient ID: Benjamin Griffith, male    DOB: [birth year], 53 y.o.   MRN: [id number]  Benjamin Griffith is a very pleasant 53 y.o. male who presents today for complete physical and follow up of chronic conditions.  Immunizations: -Tetanus: Completed in 2016 -Influenza: Declines influenza vaccine.  -Shingles: Never completed, declines    Diet: Fair diet.  Exercise: No regular exercise.  Eye exam: Completes annually  Dental exam: Completed last year  Colonoscopy: Completed in 2022, due 2032  PSA: UTD  BP Readings from Last 3 Encounters:  03/24/24 136/84  06/05/23 (!) 162/120  02/14/23 136/88       Review of Systems  Constitutional:  Negative for unexpected weight change.  HENT:  Negative for rhinorrhea.   Respiratory:  Negative for cough and shortness of breath.   Cardiovascular:  Negative for chest pain.  Gastrointestinal:  Negative for constipation and diarrhea.  Genitourinary:  Negative for difficulty urinating.  Musculoskeletal:  Negative for arthralgias and myalgias.  Skin:  Negative for rash.  Allergic/Immunologic: Negative for environmental allergies.  Neurological:  Negative for dizziness and headaches.  Psychiatric/Behavioral:  The patient is not nervous/anxious.          Past Medical History:  Diagnosis Date   Allergy    COVID-19 virus infection    Hypertension    Polycystic kidney disease    Prediabetes     Social History   Socioeconomic History   Marital status: Married    Spouse name: Not on file   Number of children: Not on file   Years of education: Not on file   Highest education level: Not on file  Occupational History   Not on file  Tobacco Use   Smoking status: Never   Smokeless tobacco: Never  Vaping Use   Vaping status: Never Used  Substance and Sexual Activity   Alcohol use: No    Alcohol/week: 0.0 standard drinks of alcohol   Drug use: No   Sexual activity: Not on file  Other Topics Concern   Not on file   Social History Narrative   Not on file   Social Drivers of Health   Financial Resource Strain: Not on file  Food Insecurity: Not on file  Transportation Needs: Not on file  Physical Activity: Not on file  Stress: Not on file  Social Connections: Not on file  Intimate Partner Violence: Not on file    History reviewed. No pertinent surgical history.  Family History  Problem Relation Age of Onset   Hypertension Mother    Prostate cancer Father    Diabetes Father    Hypertension Father    Kidney disease Maternal Grandmother    Kidney disease Paternal Grandmother    Colon cancer Neg Hx    Esophageal cancer Neg Hx    Rectal cancer Neg Hx    Stomach cancer Neg Hx     No Known Allergies  Current Outpatient Medications on File Prior to Visit  Medication Sig Dispense Refill   acetaminophen  (TYLENOL ) 325 MG tablet Take 650 mg by mouth every 6 (six) hours as needed.     AMBULATORY NON FORMULARY MEDICATION Take by mouth in the morning and at bedtime. Medication Name: herbal pumpkin     amLODipine -benazepril  (LOTREL) 10-40 MG capsule TAKE 1 CAPSULE BY MOUTH DAILY. FOR BLOOD PRESSURE. 90 capsule 0   tamsulosin  (FLOMAX ) 0.4 MG CAPS capsule Take 0.4 mg by mouth daily.     No current facility-administered medications  on file prior to visit.    BP 136/84   Pulse 92   Temp 97.9 F (36.6 C) (Oral)   Ht 5' 7.25 (1.708 m)   Wt 214 lb (97.1 kg)   SpO2 97%   BMI 33.27 kg/m  Objective:   Physical Exam HENT:     Right Ear: Tympanic membrane and ear canal normal.     Left Ear: Tympanic membrane and ear canal normal.  Eyes:     Pupils: Pupils are equal, round, and reactive to light.  Cardiovascular:     Rate and Rhythm: Normal rate and regular rhythm.  Pulmonary:     Effort: Pulmonary effort is normal.     Breath sounds: Normal breath sounds.  Abdominal:     General: Bowel sounds are normal.     Palpations: Abdomen is soft.     Tenderness: There is no abdominal tenderness.   Musculoskeletal:        General: Normal range of motion.     Cervical back: Neck supple.  Skin:    General: Skin is warm and dry.  Neurological:     Mental Status: He is alert and oriented to person, place, and time.     Cranial Nerves: No cranial nerve deficit.     Deep Tendon Reflexes:     Reflex Scores:      Patellar reflexes are 2+ on the right side and 2+ on the left side. Psychiatric:        Mood and Affect: Mood normal.     Physical Exam        Assessment & Plan:  Essential hypertension Assessment & Plan: Stable.  Continue amlodipine -benazepril  10-40 mg daily. CMP pending.  Orders: -     Lipid panel -     Comprehensive metabolic panel with GFR -     Hemoglobin A1c  Esophageal dysphagia Assessment & Plan: Never followed through with GI, no concerns today.   Polycystic kidney disease Assessment & Plan: Following with Urology through [facility]. Reviewed MR abdomen from March 2025 from [facility].   Prediabetes Assessment & Plan: Repeat A1c pending  Orders: -     Lipid panel -     Comprehensive metabolic panel with GFR -     Hemoglobin A1c  Benign prostatic hyperplasia with nocturia Assessment & Plan: Following with urology to [facility]. Continue tamsulosin  0.4 mg daily.   Preventative health care Assessment & Plan: Declines influenza vaccine and Shingrix vaccines. Colonoscopy UTD, due 2032 PSA UTD, reviewed from [facility].  Discussed the importance of a healthy diet and regular exercise in order for weight loss, and to reduce the risk of further co-morbidity.  Exam stable. Labs pending.  Follow up in 1 year for repeat physical.    History of prostate cancer Assessment & Plan: Following with [facility], office notes and scans reviewed from October November 2025 through [facility].     Assessment and Plan Assessment & Plan         Comer MARLA Gaskins, NP

## 2024-03-24 NOTE — Assessment & Plan Note (Signed)
 Stable.  Continue amlodipine -benazepril  10-40 mg daily. CMP pending.

## 2024-03-24 NOTE — Patient Instructions (Signed)
 Stop by the lab prior to leaving today. I will notify you of your results once received.   It was a pleasure to see you today!

## 2024-03-24 NOTE — Assessment & Plan Note (Signed)
 Following with [facility], office notes and scans reviewed from October November 2025 through [facility].

## 2024-03-24 NOTE — Assessment & Plan Note (Signed)
 Repeat A1c pending

## 2024-03-24 NOTE — Assessment & Plan Note (Signed)
 Following with Urology through [facility]. Reviewed MR abdomen from March 2025 from [facility].

## 2024-03-24 NOTE — Assessment & Plan Note (Signed)
 Never followed through with GI, no concerns today.

## 2024-03-24 NOTE — Assessment & Plan Note (Signed)
 Following with urology to [facility]. Continue tamsulosin  0.4 mg daily.

## 2024-03-25 ENCOUNTER — Ambulatory Visit: Payer: Self-pay | Admitting: Primary Care

## 2024-03-25 LAB — LIPID PANEL
Cholesterol: 162 mg/dL (ref 0–200)
HDL: 49.7 mg/dL (ref >39.00)
LDL Cholesterol: 103 mg/dL — ABNORMAL HIGH (ref 0–99)
NonHDL: 112.6
Total CHOL/HDL Ratio: 3
Triglycerides: 48 mg/dL (ref 0.0–149.0)
VLDL: 9.6 mg/dL (ref 0.0–40.0)

## 2024-03-25 LAB — COMPREHENSIVE METABOLIC PANEL WITH GFR
ALT: 20 U/L (ref 0–53)
AST: 28 U/L (ref 0–37)
Albumin: 4.7 g/dL (ref 3.5–5.2)
Alkaline Phosphatase: 129 U/L — ABNORMAL HIGH (ref 39–117)
BUN: 16 mg/dL (ref 6–23)
CO2: 29 meq/L (ref 19–32)
Calcium: 9.9 mg/dL (ref 8.4–10.5)
Chloride: 103 meq/L (ref 96–112)
Creatinine, Ser: 1.5 mg/dL (ref 0.40–1.50)
GFR: 53.08 mL/min — ABNORMAL LOW (ref >60.00)
Glucose, Bld: 101 mg/dL — ABNORMAL HIGH (ref 70–99)
Potassium: 4.6 meq/L (ref 3.5–5.1)
Sodium: 141 meq/L (ref 135–145)
Total Bilirubin: 0.4 mg/dL (ref 0.2–1.2)
Total Protein: 7.5 g/dL (ref 6.0–8.3)

## 2024-03-25 LAB — HEMOGLOBIN A1C: Hgb A1c MFr Bld: 6 % (ref 4.6–6.5)

## 2024-04-17 ENCOUNTER — Other Ambulatory Visit: Payer: Self-pay | Admitting: Primary Care

## 2024-04-17 DIAGNOSIS — I10 Essential (primary) hypertension: Secondary | ICD-10-CM
